# Patient Record
Sex: Female | Born: 1955
Health system: Southern US, Community
[De-identification: ages and names within clinical notes are randomized; demographics above are authoritative.]

## PROBLEM LIST (undated history)

## (undated) ENCOUNTER — Encounter

## (undated) ENCOUNTER — Ambulatory Visit: Payer: PRIVATE HEALTH INSURANCE

## (undated) ENCOUNTER — Ambulatory Visit: Attending: Internal Medicine | Primary: Internal Medicine

## (undated) ENCOUNTER — Ambulatory Visit

## (undated) ENCOUNTER — Ambulatory Visit: Payer: BLUE CROSS/BLUE SHIELD

## (undated) ENCOUNTER — Telehealth

## (undated) DIAGNOSIS — F419 Anxiety disorder, unspecified: Secondary | ICD-10-CM

## (undated) DIAGNOSIS — M810 Age-related osteoporosis without current pathological fracture: Secondary | ICD-10-CM

## (undated) DIAGNOSIS — E039 Hypothyroidism, unspecified: Secondary | ICD-10-CM

## (undated) DIAGNOSIS — K589 Irritable bowel syndrome without diarrhea: Secondary | ICD-10-CM

## (undated) DIAGNOSIS — T7840XA Allergy, unspecified, initial encounter: Secondary | ICD-10-CM

## (undated) DIAGNOSIS — M8580 Other specified disorders of bone density and structure, unspecified site: Secondary | ICD-10-CM

## (undated) DIAGNOSIS — B009 Herpesviral infection, unspecified: Secondary | ICD-10-CM

## (undated) DIAGNOSIS — E739 Lactose intolerance, unspecified: Secondary | ICD-10-CM

## (undated) DIAGNOSIS — I493 Ventricular premature depolarization: Secondary | ICD-10-CM

## (undated) HISTORY — PX: ELBOW SURGERY: SHX618

## (undated) HISTORY — DX: Hypothyroidism, unspecified: E03.9

## (undated) HISTORY — DX: Other specified disorders of bone density and structure, unspecified site: M85.80

## (undated) HISTORY — DX: Allergy, unspecified, initial encounter: T78.40XA

## (undated) HISTORY — DX: Age-related osteoporosis without current pathological fracture: M81.0

## (undated) HISTORY — DX: Ventricular premature depolarization: I49.3

## (undated) HISTORY — DX: Irritable bowel syndrome, unspecified: K58.9

## (undated) HISTORY — DX: Anxiety disorder, unspecified: F41.9

## (undated) HISTORY — DX: Lactose intolerance, unspecified: E73.9

## (undated) HISTORY — DX: Herpesviral infection, unspecified: B00.9

## (undated) HISTORY — PX: COLONOSCOPY: SHX174

## (undated) MED ORDER — LATANOPROST 0.005 % EYE DROPS: Freq: Every evening | 0.00000 days

---
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
## 1999-03-13 ENCOUNTER — Other Ambulatory Visit: Admission: RE | Admit: 1999-03-13 | Discharge: 1999-03-13 | Payer: Self-pay | Admitting: *Deleted

## 1999-04-24 ENCOUNTER — Encounter: Payer: Self-pay | Admitting: *Deleted

## 1999-04-24 ENCOUNTER — Encounter: Admission: RE | Admit: 1999-04-24 | Discharge: 1999-04-24 | Payer: Self-pay | Admitting: *Deleted

## 2000-04-08 ENCOUNTER — Other Ambulatory Visit: Admission: RE | Admit: 2000-04-08 | Discharge: 2000-04-08 | Payer: Self-pay | Admitting: *Deleted

## 2000-06-22 ENCOUNTER — Encounter: Admission: RE | Admit: 2000-06-22 | Discharge: 2000-06-22 | Payer: Self-pay | Admitting: *Deleted

## 2000-06-22 ENCOUNTER — Encounter: Payer: Self-pay | Admitting: *Deleted

## 2001-05-28 ENCOUNTER — Other Ambulatory Visit: Admission: RE | Admit: 2001-05-28 | Discharge: 2001-05-28 | Payer: Self-pay | Admitting: *Deleted

## 2001-10-08 ENCOUNTER — Ambulatory Visit (HOSPITAL_COMMUNITY): Admission: RE | Admit: 2001-10-08 | Discharge: 2001-10-08 | Payer: Self-pay | Admitting: Orthopedic Surgery

## 2001-10-08 ENCOUNTER — Encounter: Payer: Self-pay | Admitting: Orthopedic Surgery

## 2001-12-08 ENCOUNTER — Encounter: Payer: Self-pay | Admitting: Obstetrics & Gynecology

## 2001-12-08 ENCOUNTER — Encounter: Admission: RE | Admit: 2001-12-08 | Discharge: 2001-12-08 | Payer: Self-pay | Admitting: Obstetrics & Gynecology

## 2002-06-17 ENCOUNTER — Other Ambulatory Visit: Admission: RE | Admit: 2002-06-17 | Discharge: 2002-06-17 | Payer: Self-pay | Admitting: Obstetrics & Gynecology

## 2003-03-10 ENCOUNTER — Encounter: Admission: RE | Admit: 2003-03-10 | Discharge: 2003-03-10 | Payer: Self-pay | Admitting: Obstetrics & Gynecology

## 2003-07-05 ENCOUNTER — Other Ambulatory Visit: Admission: RE | Admit: 2003-07-05 | Discharge: 2003-07-05 | Payer: Self-pay | Admitting: Obstetrics & Gynecology

## 2004-05-14 ENCOUNTER — Encounter: Admission: RE | Admit: 2004-05-14 | Discharge: 2004-05-14 | Payer: Self-pay | Admitting: Obstetrics & Gynecology

## 2004-12-23 ENCOUNTER — Ambulatory Visit: Payer: Self-pay | Admitting: Internal Medicine

## 2005-02-03 HISTORY — PX: ELBOW SURGERY: SHX618

## 2005-06-03 ENCOUNTER — Ambulatory Visit: Payer: Self-pay | Admitting: Internal Medicine

## 2005-06-25 ENCOUNTER — Encounter: Admission: RE | Admit: 2005-06-25 | Discharge: 2005-06-25 | Payer: Self-pay | Admitting: Obstetrics & Gynecology

## 2006-01-14 ENCOUNTER — Encounter: Payer: Self-pay | Admitting: Internal Medicine

## 2006-01-19 ENCOUNTER — Ambulatory Visit: Payer: Self-pay | Admitting: Internal Medicine

## 2006-01-19 LAB — CONVERTED CEMR LAB
Free T4: 1.1 ng/dL (ref 0.6–1.6)
T3, Free: 2.5 pg/mL (ref 2.3–4.2)
TSH: 0.38 microintl units/mL (ref 0.35–5.50)

## 2006-02-04 ENCOUNTER — Encounter: Payer: Self-pay | Admitting: Internal Medicine

## 2006-02-04 ENCOUNTER — Ambulatory Visit: Payer: Self-pay

## 2006-02-04 ENCOUNTER — Encounter: Payer: Self-pay | Admitting: Cardiology

## 2006-02-16 ENCOUNTER — Ambulatory Visit: Payer: Self-pay | Admitting: Internal Medicine

## 2006-03-11 ENCOUNTER — Encounter: Admission: RE | Admit: 2006-03-11 | Discharge: 2006-03-11 | Payer: Self-pay | Admitting: Internal Medicine

## 2006-03-23 ENCOUNTER — Encounter (INDEPENDENT_AMBULATORY_CARE_PROVIDER_SITE_OTHER): Payer: Self-pay | Admitting: Specialist

## 2006-03-23 ENCOUNTER — Other Ambulatory Visit: Admission: RE | Admit: 2006-03-23 | Discharge: 2006-03-23 | Payer: Self-pay | Admitting: Interventional Radiology

## 2006-03-23 ENCOUNTER — Encounter: Admission: RE | Admit: 2006-03-23 | Discharge: 2006-03-23 | Payer: Self-pay | Admitting: Internal Medicine

## 2006-04-15 ENCOUNTER — Ambulatory Visit: Payer: Self-pay | Admitting: Internal Medicine

## 2006-05-26 ENCOUNTER — Encounter: Admission: RE | Admit: 2006-05-26 | Discharge: 2006-05-26 | Payer: Self-pay | Admitting: Obstetrics & Gynecology

## 2006-05-29 ENCOUNTER — Encounter (INDEPENDENT_AMBULATORY_CARE_PROVIDER_SITE_OTHER): Payer: Self-pay | Admitting: Specialist

## 2006-05-29 ENCOUNTER — Encounter: Admission: RE | Admit: 2006-05-29 | Discharge: 2006-05-29 | Payer: Self-pay | Admitting: Obstetrics & Gynecology

## 2006-07-01 ENCOUNTER — Encounter: Admission: RE | Admit: 2006-07-01 | Discharge: 2006-07-01 | Payer: Self-pay | Admitting: Endocrinology

## 2006-07-17 ENCOUNTER — Ambulatory Visit: Payer: Self-pay | Admitting: Internal Medicine

## 2006-07-17 LAB — CONVERTED CEMR LAB
Free T4: 1.1 ng/dL (ref 0.6–1.6)
T3, Free: 2.6 pg/mL (ref 2.3–4.2)
TSH: 0.61 microintl units/mL (ref 0.35–5.50)

## 2006-09-04 DIAGNOSIS — E785 Hyperlipidemia, unspecified: Secondary | ICD-10-CM | POA: Insufficient documentation

## 2006-09-04 DIAGNOSIS — J45909 Unspecified asthma, uncomplicated: Secondary | ICD-10-CM | POA: Insufficient documentation

## 2006-09-09 ENCOUNTER — Encounter: Admission: RE | Admit: 2006-09-09 | Discharge: 2006-09-09 | Payer: Self-pay | Admitting: Internal Medicine

## 2006-09-16 ENCOUNTER — Ambulatory Visit: Payer: Self-pay | Admitting: Internal Medicine

## 2006-09-16 DIAGNOSIS — B009 Herpesviral infection, unspecified: Secondary | ICD-10-CM | POA: Insufficient documentation

## 2006-09-16 DIAGNOSIS — E039 Hypothyroidism, unspecified: Secondary | ICD-10-CM | POA: Insufficient documentation

## 2006-09-16 DIAGNOSIS — F419 Anxiety disorder, unspecified: Secondary | ICD-10-CM | POA: Insufficient documentation

## 2006-10-28 ENCOUNTER — Ambulatory Visit: Payer: Self-pay | Admitting: Internal Medicine

## 2006-11-03 ENCOUNTER — Telehealth: Payer: Self-pay | Admitting: Internal Medicine

## 2006-11-04 ENCOUNTER — Ambulatory Visit: Payer: Self-pay | Admitting: Internal Medicine

## 2006-11-04 DIAGNOSIS — M899 Disorder of bone, unspecified: Secondary | ICD-10-CM | POA: Insufficient documentation

## 2006-11-04 DIAGNOSIS — M949 Disorder of cartilage, unspecified: Secondary | ICD-10-CM

## 2006-12-14 ENCOUNTER — Ambulatory Visit: Payer: Self-pay | Admitting: Internal Medicine

## 2006-12-14 DIAGNOSIS — J029 Acute pharyngitis, unspecified: Secondary | ICD-10-CM | POA: Insufficient documentation

## 2007-03-01 ENCOUNTER — Telehealth (INDEPENDENT_AMBULATORY_CARE_PROVIDER_SITE_OTHER): Payer: Self-pay | Admitting: *Deleted

## 2007-03-02 ENCOUNTER — Ambulatory Visit: Payer: Self-pay | Admitting: Vascular Surgery

## 2007-03-05 ENCOUNTER — Encounter: Admission: RE | Admit: 2007-03-05 | Discharge: 2007-03-05 | Payer: Self-pay | Admitting: Internal Medicine

## 2007-03-11 ENCOUNTER — Ambulatory Visit: Payer: Self-pay | Admitting: Internal Medicine

## 2007-03-11 ENCOUNTER — Ambulatory Visit: Payer: Self-pay | Admitting: Vascular Surgery

## 2007-03-11 LAB — CONVERTED CEMR LAB
Cholesterol, target level: 200 mg/dL
HDL goal, serum: 40 mg/dL
LDL Goal: 160 mg/dL

## 2007-04-01 ENCOUNTER — Ambulatory Visit: Payer: Self-pay | Admitting: Internal Medicine

## 2007-06-24 ENCOUNTER — Ambulatory Visit: Payer: Self-pay | Admitting: Vascular Surgery

## 2007-07-07 ENCOUNTER — Ambulatory Visit: Payer: Self-pay | Admitting: Internal Medicine

## 2007-07-07 LAB — CONVERTED CEMR LAB
ALT: 18 units/L (ref 0–35)
AST: 23 units/L (ref 0–37)
Albumin: 3.9 g/dL (ref 3.5–5.2)
Alkaline Phosphatase: 60 units/L (ref 39–117)
BUN: 12 mg/dL (ref 6–23)
Basophils Absolute: 0 10*3/uL (ref 0.0–0.1)
Basophils Relative: 0.8 % (ref 0.0–1.0)
Bilirubin Urine: NEGATIVE
Bilirubin, Direct: 0.1 mg/dL (ref 0.0–0.3)
Blood in Urine, dipstick: NEGATIVE
CO2: 30 meq/L (ref 19–32)
Calcium: 8.4 mg/dL (ref 8.4–10.5)
Chloride: 113 meq/L — ABNORMAL HIGH (ref 96–112)
Cholesterol: 179 mg/dL (ref 0–200)
Creatinine, Ser: 0.7 mg/dL (ref 0.4–1.2)
Eosinophils Absolute: 0.1 10*3/uL (ref 0.0–0.7)
Eosinophils Relative: 2.3 % (ref 0.0–5.0)
GFR calc Af Amer: 114 mL/min
GFR calc non Af Amer: 95 mL/min
Glucose, Bld: 80 mg/dL (ref 70–99)
Glucose, Urine, Semiquant: NEGATIVE
HCT: 37.8 % (ref 36.0–46.0)
HDL: 81.1 mg/dL (ref >39.0)
Hemoglobin: 12.9 g/dL (ref 12.0–15.0)
Ketones, urine, test strip: NEGATIVE
LDL Cholesterol: 89 mg/dL (ref 0–99)
Lymphocytes Relative: 28.5 % (ref 12.0–46.0)
MCHC: 34 g/dL (ref 30.0–36.0)
MCV: 96.6 fL (ref 78.0–100.0)
Monocytes Absolute: 0.3 10*3/uL (ref 0.1–1.0)
Monocytes Relative: 7.6 % (ref 3.0–12.0)
Neutro Abs: 2.5 10*3/uL (ref 1.4–7.7)
Neutrophils Relative %: 60.8 % (ref 43.0–77.0)
Nitrite: NEGATIVE
Platelets: 299 10*3/uL (ref 150–400)
Potassium: 4 meq/L (ref 3.5–5.1)
Protein, U semiquant: NEGATIVE
RBC: 3.91 M/uL (ref 3.87–5.11)
RDW: 11.8 % (ref 11.5–14.6)
Sodium: 136 meq/L (ref 135–145)
Specific Gravity, Urine: 1.025
TSH: 0.27 microintl units/mL — ABNORMAL LOW (ref 0.35–5.50)
Total Bilirubin: 0.9 mg/dL (ref 0.3–1.2)
Total CHOL/HDL Ratio: 2.2
Total Protein: 6.8 g/dL (ref 6.0–8.3)
Triglycerides: 47 mg/dL (ref 0–149)
Urobilinogen, UA: 0.2
VLDL: 9 mg/dL (ref 0–40)
WBC Urine, dipstick: NEGATIVE
WBC: 4 10*3/uL — ABNORMAL LOW (ref 4.5–10.5)
pH: 5

## 2007-07-14 ENCOUNTER — Ambulatory Visit: Payer: Self-pay | Admitting: Internal Medicine

## 2007-07-14 DIAGNOSIS — L719 Rosacea, unspecified: Secondary | ICD-10-CM | POA: Insufficient documentation

## 2007-07-14 DIAGNOSIS — E041 Nontoxic single thyroid nodule: Secondary | ICD-10-CM | POA: Insufficient documentation

## 2007-07-28 ENCOUNTER — Encounter: Admission: RE | Admit: 2007-07-28 | Discharge: 2007-07-28 | Payer: Self-pay | Admitting: Obstetrics & Gynecology

## 2007-07-28 ENCOUNTER — Encounter: Admission: RE | Admit: 2007-07-28 | Discharge: 2007-07-28 | Payer: Self-pay | Admitting: Internal Medicine

## 2007-08-18 ENCOUNTER — Ambulatory Visit: Payer: Self-pay | Admitting: Internal Medicine

## 2007-08-18 DIAGNOSIS — K589 Irritable bowel syndrome without diarrhea: Secondary | ICD-10-CM | POA: Insufficient documentation

## 2007-08-18 DIAGNOSIS — B079 Viral wart, unspecified: Secondary | ICD-10-CM | POA: Insufficient documentation

## 2007-08-25 ENCOUNTER — Telehealth: Payer: Self-pay | Admitting: Internal Medicine

## 2007-11-25 ENCOUNTER — Ambulatory Visit: Payer: Self-pay | Admitting: Internal Medicine

## 2008-03-09 ENCOUNTER — Ambulatory Visit: Payer: Self-pay | Admitting: Internal Medicine

## 2008-07-25 ENCOUNTER — Telehealth (INDEPENDENT_AMBULATORY_CARE_PROVIDER_SITE_OTHER): Payer: Self-pay | Admitting: *Deleted

## 2008-08-21 ENCOUNTER — Encounter: Payer: Self-pay | Admitting: Internal Medicine

## 2008-09-04 ENCOUNTER — Encounter: Admission: RE | Admit: 2008-09-04 | Discharge: 2008-09-04 | Payer: Self-pay | Admitting: Obstetrics & Gynecology

## 2008-10-24 ENCOUNTER — Telehealth: Payer: Self-pay | Admitting: *Deleted

## 2008-10-25 ENCOUNTER — Ambulatory Visit: Payer: Self-pay | Admitting: Internal Medicine

## 2008-10-25 LAB — CONVERTED CEMR LAB
Bilirubin Urine: NEGATIVE
Glucose, Urine, Semiquant: NEGATIVE
Ketones, urine, test strip: NEGATIVE
Nitrite: NEGATIVE
Protein, U semiquant: NEGATIVE
Specific Gravity, Urine: 1.01
Urobilinogen, UA: 0.2
pH: 5.5

## 2008-11-01 ENCOUNTER — Telehealth: Payer: Self-pay | Admitting: Internal Medicine

## 2009-09-14 ENCOUNTER — Encounter: Admission: RE | Admit: 2009-09-14 | Discharge: 2009-09-14 | Payer: Self-pay | Admitting: Obstetrics & Gynecology

## 2009-09-21 ENCOUNTER — Encounter: Admission: RE | Admit: 2009-09-21 | Discharge: 2009-09-21 | Payer: Self-pay | Admitting: Obstetrics & Gynecology

## 2009-10-17 ENCOUNTER — Encounter: Admission: RE | Admit: 2009-10-17 | Discharge: 2009-10-17 | Payer: Self-pay | Admitting: Internal Medicine

## 2009-12-04 ENCOUNTER — Ambulatory Visit: Payer: Self-pay | Admitting: Family Medicine

## 2009-12-04 ENCOUNTER — Telehealth (INDEPENDENT_AMBULATORY_CARE_PROVIDER_SITE_OTHER): Payer: Self-pay | Admitting: *Deleted

## 2009-12-04 DIAGNOSIS — R197 Diarrhea, unspecified: Secondary | ICD-10-CM | POA: Insufficient documentation

## 2009-12-04 LAB — CONVERTED CEMR LAB
ALT: 18 units/L (ref 0–35)
AST: 23 units/L (ref 0–37)
Albumin: 3.7 g/dL (ref 3.5–5.2)
Alkaline Phosphatase: 49 units/L (ref 39–117)
BUN: 12 mg/dL (ref 6–23)
Basophils Absolute: 0 10*3/uL (ref 0.0–0.1)
Basophils Relative: 0.7 % (ref 0.0–3.0)
Bilirubin, Direct: 0.1 mg/dL (ref 0.0–0.3)
CO2: 28 meq/L (ref 19–32)
Calcium: 9.1 mg/dL (ref 8.4–10.5)
Chloride: 105 meq/L (ref 96–112)
Creatinine, Ser: 0.7 mg/dL (ref 0.4–1.2)
Eosinophils Absolute: 0.1 10*3/uL (ref 0.0–0.7)
Eosinophils Relative: 2.4 % (ref 0.0–5.0)
GFR calc non Af Amer: 96.6 mL/min (ref >60)
Glucose, Bld: 77 mg/dL (ref 70–99)
HCT: 37.6 % (ref 36.0–46.0)
Hemoglobin: 13.1 g/dL (ref 12.0–15.0)
Lymphocytes Relative: 28.1 % (ref 12.0–46.0)
Lymphs Abs: 1.5 10*3/uL (ref 0.7–4.0)
MCHC: 34.8 g/dL (ref 30.0–36.0)
MCV: 94.9 fL (ref 78.0–100.0)
Monocytes Absolute: 0.5 10*3/uL (ref 0.1–1.0)
Monocytes Relative: 8.7 % (ref 3.0–12.0)
Neutro Abs: 3.2 10*3/uL (ref 1.4–7.7)
Neutrophils Relative %: 60.1 % (ref 43.0–77.0)
Platelets: 382 10*3/uL (ref 150.0–400.0)
Potassium: 4.4 meq/L (ref 3.5–5.1)
RBC: 3.96 M/uL (ref 3.87–5.11)
RDW: 13.3 % (ref 11.5–14.6)
Sed Rate: 16 mm/hr (ref 0–22)
Sodium: 139 meq/L (ref 135–145)
TSH: 0.96 microintl units/mL (ref 0.35–5.50)
Total Bilirubin: 0.8 mg/dL (ref 0.3–1.2)
Total Protein: 6.6 g/dL (ref 6.0–8.3)
WBC: 5.4 10*3/uL (ref 4.5–10.5)

## 2009-12-07 ENCOUNTER — Encounter: Payer: Self-pay | Admitting: Internal Medicine

## 2009-12-07 ENCOUNTER — Telehealth: Payer: Self-pay | Admitting: Internal Medicine

## 2009-12-21 ENCOUNTER — Encounter: Payer: Self-pay | Admitting: Internal Medicine

## 2010-02-05 ENCOUNTER — Ambulatory Visit: Admission: RE | Admit: 2010-02-05 | Payer: Self-pay | Source: Home / Self Care | Admitting: Internal Medicine

## 2010-02-05 ENCOUNTER — Encounter: Payer: Self-pay | Admitting: Internal Medicine

## 2010-03-05 NOTE — Progress Notes (Signed)
Summary: test results  Phone Note Call from Patient Call back at Work Phone [phone]   Caller: Patient Call For: Dr. Abner Greenspan Summary of Call: Please call regarding test results. Initial call taken by: Lynann Beaver CMA AAMA,  December 07, 2009 12:13 PM  Follow-up for Phone Call        Patient called requesting results. Follow-up by: Josph Macho RMA,  December 10, 2009 12:54 PM  Additional Follow-up for Phone Call Additional follow up Details #1::        Pt called back again requesting lab results. Additional Follow-up by: Lucy Antigua,  December 10, 2009 2:58 PM    Additional Follow-up for Phone Call Additional follow up Details #2::    LABS WERE ALL NEGATIVE- HAS BEEN TAKING PEPTO BISMOL- SUGGESTED SHE DRINK CLEAR LIQUIDS FOR 48 HOURS AND TAKE I MODIUM AND IF NOT A LOT BETTER BY TOMORROW CALL BACK TOMORROW AFTERNOON Follow-up by: Willy Eddy, LPN,  December 10, 2009 4:54 PM

## 2010-03-05 NOTE — Assessment & Plan Note (Signed)
Summary: DIARRHEA // RS   Vital Signs:  Patient profile:   55 year old female Height:      65 inches (165.10 cm) Weight:      127 pounds (57.73 kg) BMI:     21.21 O2 Sat:      97 % on Room air Temp:     98.3 degrees F (36.83 degrees C) oral Pulse rate:   74 / minute BP sitting:   132 / 108  (left arm) Cuff size:   regular  Vitals Entered By: Josph Macho RMA (December 04, 2009 9:19 AM)  O2 Flow:  Room air CC: Diarrhea X9 days/ CF Is Patient Diabetic? No   History of Present Illness: Patient is a  55 yo caucasian female in today for evaluation of diarrhea x 9 days. A week ago Monday it started with increased gurgling, rumbling in stomach and then she began to have frequent, loose stool. she describes the diarrhea is loose smelly. She describes fecal urgency and frequency. Has had some intermittent nausea and anorexia but then followed by extreme hunger. She denies fevers, chills, vomiting, back pain. Denies any significant abdominal pain except some cramping right before a bowel movement. Denies chest pain shortness of breath denies any external irritation or hemorrhoids. 10 years ago did have a bad episode of recurrent diarrhea with significant weight loss and was ultimately diagnosed with IBS. The symptoms didn't return and were associated with significant pain. Her only change in medications has been her OB/GYN started her on Cymbalta 30 mg p.o. q.d. but that was back in August and that was for some irritability and perimenopausal symptoms probably other complaints this week or increased fatigue she did have a mild left temporal headache but that is resolved and some generalized myalgias most notably with some intermittent stiffness in her neck especially when turning her head. And some stiffness into her shoulder blades as well of note her mother and her maternal grandmother was noted to have significant reflux issues in her life times. try a line in the past but felt that caused worse  diarrhea has not tried anything in that regard presently. He is running out of her Atrovent for her exercise-induced asthma does not feel it is helpful anymore. Albuterol caused some palpitations in the past  Current Medications (verified): 1)  Synthroid 88 Mcg Tabs (Levothyroxine Sodium) .... Take One Tablet By Mouth 2)  Singulair 10 Mg  Tabs (Montelukast Sodium) .... Take 1 Tablet By Mouth Once A Day 3)  Astelin 137 Mcg/spray  Soln (Azelastine Hcl) .... As Needed 4)  Zyrtec Allergy 10 Mg  Tabs (Cetirizine Hcl) .... As Needed 5)  Atrovent Hfa 17 Mcg/act  Aers (Ipratropium Bromide Hfa) .... Two Puff By Mouth Q 4 Hours Prn 6)  Celebrex 200 Mg Caps (Celecoxib) .... One By Mouth Daily 7)  Cymbalta 30 Mg Cpep (Duloxetine Hcl) .... Once Daily  Allergies (verified): 1)  ! Augmentin  Past History:  Past medical history reviewed for relevance to current acute and chronic problems. Social history (including risk factors) reviewed for relevance to current acute and chronic problems.  Past Medical History: Reviewed history from 09/16/2006 and no changes required. Asthma Hypothyroidism HSV  Social History: Reviewed history from 09/04/2006 and no changes required. Married Former Smoker Alcohol use-yes  Review of Systems      See HPI  Physical Exam  General:  Well-developed,well-nourished,in no acute distress; alert,appropriate and cooperative throughout examination Head:  Normocephalic and atraumatic without obvious abnormalities.  No apparent alopecia or balding. Mouth:  Oral mucosa and oropharynx without lesions or exudates.  Teeth in good repair. Neck:  No deformities, masses, or tenderness noted. Lungs:  Normal respiratory effort, chest expands symmetrically. Lungs are clear to auscultation, no crackles or wheezes. Heart:  Normal rate and regular rhythm. S1 and S2 normal without gallop, murmur, click, rub or other extra sounds. Abdomen:  Bowel sounds positive,abdomen soft and  non-tender without masses, organomegaly or hernias noted. Extremities:  No clubbing, cyanosis, edema, or deformity noted with normal full range of motion of all joints.   Cervical Nodes:  No lymphadenopathy noted Psych:  Cognition and judgment appear intact. Alert and cooperative with normal attention span and concentration. No apparent delusions, illusions, hallucinationsslightly anxious.     Impression & Recommendations:  Problem # 1:  DIARRHEA (ICD-787.91)  Orders: TLB-CBC Platelet - w/Differential (85025-CBCD) TLB-BMP (Basic Metabolic Panel-BMET) (80048-METABOL) TLB-Hepatic/Liver Function Pnl (80076-HEPATIC) TLB-TSH (Thyroid Stimulating Hormone) (84443-TSH) TLB-Sedimentation Rate (ESR) (85652-ESR) T-Culture, Stool (87045/87046-70140) T-Culture, C-Diff Toxin A/B (87564-33295) T-Stool Fats [place] Stain [id number]) T-Stool Giardia / Crypto- EIA (01601) T-Stool for O&P (09323-55732) T-Culture, C-Diff Toxin A/B (20254-27062) T-Culture, C-Diff Toxin A/B (37628-31517) Gastroenterology Referral (GI) Specimen Handling (99000) Venipuncture (61607) Try a different probiotic such as [employer] colon health and start Benefiber 2 tsp by mouth two times a day as needed diarrhea and refered to GI for further evaluation and colonoscopy  Problem # 2:  ASTHMA (ICD-493.90)  The following medications were removed from the medication list:    Atrovent Hfa 17 Mcg/act Aers (Ipratropium bromide hfa) .Marland Kitchen..Marland Kitchen Two puff by mouth q 4 hours prn    Methylprednisolone 4 Mg Tabs (Methylprednisolone) .Marland KitchenMarland KitchenMarland KitchenMarland Kitchen 4 for 2 days then 3 for 2 days then 2 for 2 days then 1 for 2 days Her updated medication list for this problem includes:    Singulair 10 Mg Tabs (Montelukast sodium) .Marland Kitchen... Take 1 tablet by mouth once a day    Xopenex Hfa 45 Mcg/act Aero (Levalbuterol tartrate) .Marland Kitchen... 1-2 puffs by mouth q 4-6 hours as needed exercise/sob/wheeze Given a coupon for zero copay so she can try Xopenex to make sure she tolerates  it  Complete Medication List: 1)  Synthroid 88 Mcg Tabs (Levothyroxine sodium) .... Take one tablet by mouth 2)  Singulair 10 Mg Tabs (Montelukast sodium) .... Take 1 tablet by mouth once a day 3)  Astelin 137 Mcg/spray Soln (Azelastine hcl) .... As needed 4)  Zyrtec Allergy 10 Mg Tabs (Cetirizine hcl) .... As needed 5)  Celebrex 200 Mg Caps (Celecoxib) .... One by mouth daily 6)  Cymbalta 30 Mg Cpep (Duloxetine hcl) .... Once daily 7)  Xopenex Hfa 45 Mcg/act Aero (Levalbuterol tartrate) .Marland Kitchen.. 1-2 puffs by mouth q 4-6 hours as needed exercise/sob/wheeze  Patient Instructions: 1)  Stick with a low residue diet and avoid foods that can irritate your bowels. 2)  Avoid foods high in acid(tomatoes, citrus juices,spicy foods).Avoid eating within two hours of lying down or before exercising. Do not over eat: try smaller more frequent meals. Elevate head of bed twelve inches when sleeping.  3)  Try Benefiber powder 2 tsp by mouth two times a day as needed diarrhea 4)  Try a probiotic such [facility] and/or  lactose free yogurt Prescriptions: XOPENEX HFA 45 MCG/ACT AERO (LEVALBUTEROL TARTRATE) 1-2 puffs by mouth q 4-6 hours as needed exercise/sob/wheeze  #1 x 1   Entered and Authorized by:   Danise Edge MD   Signed by:   Danise Edge MD  on 12/04/2009   Method used:   Electronically to        [facility] (retail)       [address]       [place], [place]  [address]       Ph: [phone] or [phone]       Fax: [phone]   RxID:   [id number]    Orders Added: 1)  TLB-CBC Platelet - w/Differential [85025-CBCD] 2)  TLB-BMP (Basic Metabolic Panel-BMET) [80048-METABOL] 3)  TLB-Hepatic/Liver Function Pnl [80076-HEPATIC] 4)  TLB-TSH (Thyroid Stimulating Hormone) [84443-TSH] 5)  TLB-Sedimentation Rate (ESR) [85652-ESR] 6)  T-Culture, Stool [87045/87046-70140] 7)  T-Culture, C-Diff Toxin A/B [36644-03474] 8)  T-Stool Fats [place] Stain [25956-38756] 9)  T-Stool  Giardia / Crypto- EIA [43329] 10)  T-Stool for O&P [87177-70555] 11)  T-Culture, C-Diff Toxin A/B [51884-16606] 12)  T-Culture, C-Diff Toxin A/B [30160-10932] 13)  Gastroenterology Referral [GI] 14)  Specimen Handling [99000] 15)  Venipuncture [36415] 16)  Est. Patient Level III [35573]

## 2010-03-05 NOTE — Medication Information (Signed)
Summary: Prior Authorization Request and Approval for Celebrex  Prior Authorization Request and Approval for Celebrex   Imported By: Maryln Gottron 08/25/2008 08:08:27  _____________________________________________________________________  External Attachment:    Type:   Image     Comment:   External Document

## 2010-03-05 NOTE — Progress Notes (Signed)
Summary: Rx Refill   Phone Note Outgoing Call   Summary of Call: Pt states rx for anti diarrheal should have been called in to [facility]. Please send in Initial call taken by: Trixie Dredge,  December 04, 2009 5:13 PM  Follow-up for Phone Call        I did not send an antidiarrheal med I cannot prescribe one until I get the cultures back and make sure she does not have any bad infections Follow-up by: Danise Edge MD,  December 04, 2009 5:18 PM  Additional Follow-up for Phone Call Additional follow up Details #1::        Patient informed Additional Follow-up by: Josph Macho RMA,  December 05, 2009 8:31 AM

## 2010-03-05 NOTE — Assessment & Plan Note (Signed)
Summary: ?sinus inf ok per doc 2.15p   Vital Signs:  Patient Profile:   55 Years Old Female Height:     65 inches Temp:     98.5 degrees F oral Pulse rate:   72 / minute Resp:     12 per minute BP sitting:   120 / 70  (left arm)  Vitals Entered By: Willy Eddy, LPN (December 14, 2006 3:02 PM)                 Chief Complaint:  c/o sinusitis like sx.  History of Present Illness: sinus pressure x 24 hours with head ache and ear and throat pain no fever had chills  Current Allergies: ! AUGMENTIN  Past Medical History:    Reviewed history from 09/16/2006 and no changes required:       Asthma       Hypothyroidism       HSV     Review of Systems       The patient complains of fever and hoarseness.     Physical Exam  General:     Well-developed,well-nourished,in no acute distress; alert,appropriate and cooperative throughout examination Mouth:     tonsil hypertropied, pharyngeal erythema, pharyngeal exudate, and glossitis.   Neck:     No deformities, masses, or tenderness noted. Lungs:     no wheezes.   Heart:     Normal rate and regular rhythm. S1 and S2 normal without gallop, murmur, click, rub or other extra sounds.    Impression & Recommendations:  Problem # 1:  ACUTE PHARYNGITIS (ICD-462) zpack Her updated medication list for this problem includes:    Zithromax Z-pak 250 Mg Tabs (Azithromycin) .Marland Kitchen... Take as directed Instructed to complete antibiotics and call if not improved in 48 hours.   Complete Medication List: 1)  Synthroid 112 Mcg Tabs (Levothyroxine sodium) .... Take 1 tablet by mouth once a day 2)  Singulair 10 Mg Tabs (Montelukast sodium) .... Take 1 tablet by mouth once a day 3)  Astelin 137 Mcg/spray Soln (Azelastine hcl) .... As needed 4)  Zyrtec Allergy 10 Mg Tabs (Cetirizine hcl) .... As needed 5)  Atrovent Hfa 17 Mcg/act Aers (Ipratropium bromide hfa) .... Two puff by mouth q 4 hours prn 6)  Allerx Df 4-2.5 & 8-2.5 Mg Misc  (Chlorpheniramine-methscop) .... Two times a day 7)  Zithromax Z-pak 250 Mg Tabs (Azithromycin) .... Take as directed   Patient Instructions: 1)  Take your antibiotic as prescribed until ALL of it is gone, but stop if you develop a rash or swelling and contact our office as soon as possible.    Prescriptions: ZITHROMAX Z-PAK 250 MG  TABS (AZITHROMYCIN) take as directed  #1 x 0   Entered and Authorized by:   Stacie Glaze MD   Signed by:   Stacie Glaze MD on 12/14/2006   Method used:   Print then Give to Patient   RxID:   [id number]  ]

## 2010-03-05 NOTE — Assessment & Plan Note (Signed)
Summary: uri/njr   Vital Signs:  Patient Profile:   55 Years Old Female Height:     65 inches Temp:     98.7 degrees F oral Pulse rate:   72 / minute Resp:     14 per minute BP sitting:   120 / 78  (left arm)  Vitals Entered By: Willy Eddy, LPN (April 01, 2007 4:23 PM)                 Chief Complaint:  c/osorethroat, cough, fatigue, and and headache for 2 weeks.  History of Present Illness: recurrent sore throat for 2 weeks since last visit zero energy HA , fatigue, ear pain and increased wheezing hx of allergies, asthma, no exposure ot strept.     Current Allergies: ! AUGMENTIN  Past Medical History:    Reviewed history from 09/16/2006 and no changes required:       Asthma       Hypothyroidism       HSV  Past Surgical History:    Reviewed history from 11/04/2006 and no changes required:       Denies surgical history   Family History:    Reviewed history from 09/04/2006 and no changes required:       Family History Hypertension       Family History of Melanoma  Social History:    Reviewed history from 09/04/2006 and no changes required:       Married       Former Smoker       Alcohol use-yes    Review of Systems  The patient denies anorexia, fever, weight loss, weight gain, vision loss, decreased hearing, hoarseness, chest pain, syncope, dyspnea on exhertion, peripheral edema, prolonged cough, hemoptysis, abdominal pain, melena, hematochezia, severe indigestion/heartburn, hematuria, incontinence, genital sores, muscle weakness, suspicious skin lesions, transient blindness, difficulty walking, depression, unusual weight change, enlarged lymph nodes, angioedema, breast masses, and testicular masses.     Physical Exam  General:     Well-developed,well-nourished,in no acute distress; alert,appropriate and cooperative throughout examination Head:     Normocephalic and atraumatic without obvious abnormalities. No apparent alopecia or balding.  Eyes:     pupils equal and pupils round.   Nose:     nasal dischargemucosal pallor.   Mouth:     tonsil hypertropied, pharyngeal erythema, pharyngeal exudate, and glossitis.   Neck:     No deformities, masses, or tenderness noted. Lungs:     no wheezes.  but tight Heart:     Normal rate and regular rhythm. S1 and S2 normal without gallop, murmur, click, rub or other extra sounds. Abdomen:     soft, non-tender, and normal bowel sounds.      Impression & Recommendations:  Problem # 1:  URI (ICD-465.9) probable viral but pulse, RR and sats suggest impending asthma flair probable viral induced The following medications were removed from the medication list:    Allerx Df 4-2.5 & 8-2.5 Mg Misc (Chlorpheniramine-methscop) .Marland Kitchen..Marland Kitchen Two times a day  Her updated medication list for this problem includes:    Zyrtec Allergy 10 Mg Tabs (Cetirizine hcl) .Marland Kitchen... As needed    Atuss Ds 30-4-30 Mg/34ml Susp (Pseudoephed hcl-cpm-dm hbr tan) .Marland Kitchen..Marland Kitchen Two tsp by mouth q 6 hour prn Instructed on symptomatic treatment. Call if symptoms persist or worsen.   Problem # 2:  URI (ICD-465.9)  The following medications were removed from the medication list:    Allerx Df 4-2.5 & 8-2.5 Mg Misc (Chlorpheniramine-methscop) .Marland Kitchen..Marland Kitchen Two  times a day  Her updated medication list for this problem includes:    Zyrtec Allergy 10 Mg Tabs (Cetirizine hcl) .Marland Kitchen... As needed    Atuss Ds 30-4-30 Mg/53ml Susp (Pseudoephed hcl-cpm-dm hbr tan) .Marland Kitchen..Marland Kitchen Two tsp by mouth q 6 hour prn Instructed on symptomatic treatment. Call if symptoms persist or worsen.   Problem # 3:  ASTHMA (ICD-493.90)  Her updated medication list for this problem includes:    Singulair 10 Mg Tabs (Montelukast sodium) .Marland Kitchen... Take 1 tablet by mouth once a day    Atrovent Hfa 17 Mcg/act Aers (Ipratropium bromide hfa) .Marland Kitchen..Marland Kitchen Two puff by mouth q 4 hours prn    Medrol 4 Mg Tabs (Methylprednisolone) .Marland KitchenMarland KitchenMarland KitchenMarland Kitchen 4 by mouth x3 days, 3 by mouth x 3 days the 2 by mouth for 2 days, the  1 by mouth x 2 days then off   Complete Medication List: 1)  Synthroid 112 Mcg Tabs (Levothyroxine sodium) .... Take 1 tablet by mouth once a day 2)  Singulair 10 Mg Tabs (Montelukast sodium) .... Take 1 tablet by mouth once a day 3)  Astelin 137 Mcg/spray Soln (Azelastine hcl) .... As needed 4)  Zyrtec Allergy 10 Mg Tabs (Cetirizine hcl) .... As needed 5)  Atrovent Hfa 17 Mcg/act Aers (Ipratropium bromide hfa) .... Two puff by mouth q 4 hours prn 6)  Optivar 0.05 % Soln (Azelastine hcl) .... One drop ou bid 7)  Doxycycline Hyclate 100 Mg Tabs (Doxycycline hyclate) .... One by mouth bid 8)  Medrol 4 Mg Tabs (Methylprednisolone) .... 4 by mouth x3 days, 3 by mouth x 3 days the 2 by mouth for 2 days, the 1 by mouth x 2 days then off 9)  Atuss Ds 30-4-30 Mg/66ml Susp (Pseudoephed hcl-cpm-dm hbr tan) .... Two tsp by mouth q 6 hour prn   Patient Instructions: 1)  Take your antibiotic as prescribed until ALL of it is gone, but stop if you develop a rash or swelling and contact our office as soon as possible.    Prescriptions: ATUSS DS 30-4-30 MG/5ML  SUSP (PSEUDOEPHED HCL-CPM-DM HBR TAN) two tsp by mouth q 6 hour prn  #6 oz x 0   Entered and Authorized by:   Stacie Glaze MD   Signed by:   Stacie Glaze MD on 04/01/2007   Method used:   Electronically sent to ...       [facility]       [address]       [place], [place]  [id number]       Ph: [phone] or [phone]       Fax: [phone]   RxID:   [id number] MEDROL 4 MG  TABS (METHYLPREDNISOLONE) 4 by mouth x3 days, 3 by mouth x 3 days the 2 by mouth for 2 days, the 1 by mouth x 2 days then off  #28 x 0   Entered and Authorized by:   Stacie Glaze MD   Signed by:   Stacie Glaze MD on 04/01/2007   Method used:   Electronically sent to ...       [facility]       [address]       [place], [place]  [id number]       Ph: [phone] or [phone]       Fax: [phone]   RxID:   [id number]  DOXYCYCLINE HYCLATE 100 MG  TABS (DOXYCYCLINE HYCLATE) one by mouth bid  #20 x 0  Entered and Authorized by:   Stacie Glaze MD   Signed by:   Stacie Glaze MD on 04/01/2007   Method used:   Electronically sent to ...       [facility]       [address]       [place], [place]  [address]       Ph: [phone] or [phone]       Fax: [phone]   RxID:   [id number]  ]  Appended Document: Orders Update    Clinical Lists Changes  Orders: Added new Service order of Depo- Medrol 40mg  (J1030) - Signed Added new Service order of Admin of Therapeutic Inj  intramuscular or subcutaneous (36644) - Signed        Medication Administration  Injection # 1:    Medication: Depo- Medrol 80mg     Diagnosis: ACUTE PHARYNGITIS (ICD-462)    Route: IM    Site: RUOQ gluteus    Exp Date: 01/04/2008    Lot #: [id number]    Mfr: sicor    Given by: Willy Eddy, LPN (April 01, 2007 5:05 PM)  Orders Added: 1)  Depo- Medrol 40mg  [J1030] 2)  Admin of Therapeutic Inj  intramuscular or subcutaneous [56387]

## 2010-03-05 NOTE — Letter (Signed)
Summary: New Patient letter  [facility]  [address] [place], [place] [address]   Phone: [phone]  Fax: [phone]       12/21/2009 MRN: [id number]  [facility] [place] [address] [place], [place]  [address]  Dear Tasha Jackson,  Welcome to the Gastroenterology Division at [facility].    You are scheduled to see Dr.  Juanda Chance on 02-05-2010 at 9:15am on the 3rd floor at [facility], [address]  We ask that you try to arrive at our office 15 minutes prior to your appointment time to allow for check-in.  We would like you to complete the enclosed self-administered evaluation form prior to your visit and bring it with you on the day of your appointment.  We will review it with you.  Also, please bring a complete list of all your medications or, if you prefer, bring the medication bottles and we will list them.  Please bring your insurance card so that we may make a copy of it.  If your insurance requires a referral to see a specialist, please bring your referral form from your primary care physician.  Co-payments are due at the time of your visit and may be paid by cash, check or credit card.     Your office visit will consist of a consult with your physician (includes a physical exam), any laboratory testing he/she may order, scheduling of any necessary diagnostic testing (e.g. x-ray, ultrasound, CT-scan), and scheduling of a procedure (e.g. Endoscopy, Colonoscopy) if required.  Please allow enough time on your schedule to allow for any/all of these possibilities.    If you cannot keep your appointment, please call [phone] to cancel or reschedule prior to your appointment date.  This allows [name] the opportunity to schedule an appointment for another patient in need of care.  If you do not cancel or reschedule by 5 p.m. the business day prior to your appointment date, you will be charged a $50.00 late cancellation/no-show fee.    Thank you for choosing  [facility] for your medical needs.  We appreciate the opportunity to care for you.  Please visit [name] at our website  to learn more about our practice.                     Sincerely,                                                             The Gastroenterology Division

## 2010-03-05 NOTE — Progress Notes (Signed)
Summary: appt  Phone Note Call from Patient Call back at Home Phone [phone]   Caller: Patient Call For: Dr Lovell Sheehan Summary of Call: pt say she has a sinus infection and feel that she has the virus. pls call to see if she can come in today or tomorrow [facility] Initial call taken by: Shan Levans,  November 03, 2006 2:50 PM  Follow-up for Phone Call        aPPOINTMENT given tomorrow.  Patient notified. Follow-up by: Rudy Jew, RN,  November 03, 2006 3:03 PM

## 2010-03-05 NOTE — Assessment & Plan Note (Signed)
Summary: uri,uti/bmw   Vital Signs:  Patient profile:   55 year old female Height:      65 inches Weight:      125 pounds BMI:     20.88 Temp:     98.3 degrees F oral Pulse rate:   72 / minute Resp:     14 per minute BP sitting:   122 / 78  (left arm)  Vitals Entered By: Willy Eddy, LPN (October 25, 2008 11:33 AM)  CC:  c/o uti sx and sinusitis like sx.  History of Present Illness: left the windows open and the allergies flaired with bad head aches. She has symptoms for UTI with urgency and burning Increased PND with sore throat butt no ever has not been excessively SOB but has felt some heaviness   Problems Prior to Update: 1)  Wart, Viral  (ICD-078.10) 2)  Ibs  (ICD-564.1) 3)  Preventive Health Care  (ICD-V70.0) 4)  Acne Rosacea  (ICD-695.3) 5)  Thyroid Nodule  (ICD-241.0) 6)  Acute Pharyngitis  (ICD-462) 7)  Osteopenia  (ICD-733.90) 8)  Uri  (ICD-465.9) 9)  Palpitations, Recurrent  (ICD-785.1) 10)  Herpes Simplex, Uncomplicated  (ICD-054.9) 11)  Hypothyroidism  (ICD-244.9) 12)  Family History of Melanoma  (ICD-V16.8) 13)  Hyperlipidemia  (ICD-272.4) 14)  Asthma  (ICD-493.90)  Medications Prior to Update: 1)  Synthroid 112 Mcg Tabs (Levothyroxine Sodium) .... Take 1 Tablet By Mouth Once A Day 2)  Singulair 10 Mg  Tabs (Montelukast Sodium) .... Take 1 Tablet By Mouth Once A Day 3)  Astelin 137 Mcg/spray  Soln (Azelastine Hcl) .... As Needed 4)  Zyrtec Allergy 10 Mg  Tabs (Cetirizine Hcl) .... As Needed 5)  Atrovent Hfa 17 Mcg/act  Aers (Ipratropium Bromide Hfa) .... Two Puff By Mouth Q 4 Hours Prn 6)  Minocin 50 Mg  Caps (Minocycline Hcl) .... One By Mouth Daily 7)  Duac Cs 1-5 %  Kit (Clindamycin-Benzoyl Per-Cleans) .... Apply To Face As Directed 8)  Methylprednisolone 4 Mg Tabs (Methylprednisolone) .... 4 For 2 Days Then 3 For 2 Days Then 2 For 2 Days Then 1 For 2 Days 9)  Clarithromycin 500 Mg Xr24h-Tab (Clarithromycin) .... Two By Mouth Daily For 7 Days  10)  Allanvan-Dm 12.07-03-23 Mg/10ml Susp (Phenyleph Tan-Pyril Tan-Dm Tan) .... Two Tsp By Mouth Two Times A Day  ( May Substitue Similar Product) 11)  Celebrex 200 Mg Caps (Celecoxib) .... One By Mouth Daily  Current Medications (verified): 1)  Synthroid 112 Mcg Tabs (Levothyroxine Sodium) .... Take 1 Tablet By Mouth Once A Day 2)  Singulair 10 Mg  Tabs (Montelukast Sodium) .... Take 1 Tablet By Mouth Once A Day 3)  Astelin 137 Mcg/spray  Soln (Azelastine Hcl) .... As Needed 4)  Zyrtec Allergy 10 Mg  Tabs (Cetirizine Hcl) .... As Needed 5)  Atrovent Hfa 17 Mcg/act  Aers (Ipratropium Bromide Hfa) .... Two Puff By Mouth Q 4 Hours Prn 6)  Minocin 50 Mg  Caps (Minocycline Hcl) .... One By Mouth Daily 7)  Duac Cs 1-5 %  Kit (Clindamycin-Benzoyl Per-Cleans) .... Apply To Face As Directed 8)  Methylprednisolone 4 Mg Tabs (Methylprednisolone) .... 4 For 2 Days Then 3 For 2 Days Then 2 For 2 Days Then 1 For 2 Days 9)  Celebrex 200 Mg Caps (Celecoxib) .... One By Mouth Daily 10)  Levaquin 500 Mg Tabs (Levofloxacin) .... One By Mouth Daily For 7 Days  ( 3 Samples Given)  Allergies (verified): 1)  !  Augmentin  Past History:  Family History: Last updated: 09/04/2006 Family History Hypertension Family History of Melanoma  Social History: Last updated: 09/04/2006 Married Former Smoker Alcohol use-yes  Risk Factors: Smoking Status: quit (09/04/2006)  Past medical, surgical, family and social histories (including risk factors) reviewed, and no changes noted (except as noted below).  Past Medical History: Reviewed history from 09/16/2006 and no changes required. Asthma Hypothyroidism HSV  Past Surgical History: Reviewed history from 11/04/2006 and no changes required. Denies surgical history  Family History: Reviewed history from 09/04/2006 and no changes required. Family History Hypertension Family History of Melanoma  Social History: Reviewed history from 09/04/2006 and no changes  required. Married Former Smoker Alcohol use-yes  Review of Systems  The patient denies anorexia, fever, weight loss, weight gain, vision loss, decreased hearing, hoarseness, chest pain, syncope, dyspnea on exertion, peripheral edema, prolonged cough, headaches, hemoptysis, abdominal pain, melena, hematochezia, severe indigestion/heartburn, hematuria, incontinence, genital sores, muscle weakness, suspicious skin lesions, transient blindness, difficulty walking, depression, unusual weight change, abnormal bleeding, enlarged lymph nodes, angioedema, and breast masses.    Physical Exam  General:  Well-developed,well-nourished,in no acute distress; alert,appropriate and cooperative throughout examination Head:  Normocephalic and atraumatic without obvious abnormalities. No apparent alopecia or balding. Eyes:  pupils equal and pupils round.   Ears:  R ear normal and L ear normal.   Nose:  nasal dischargemucosal pallor, mucosal erythema, and mucosal edema.   Mouth:  pharyngeal exudate, posterior lymphoid hypertrophy, and postnasal drip.   Neck:  No deformities, masses, or tenderness noted. Lungs:  Normal respiratory effort, chest expands symmetrically. Lungs are clear to auscultation, no crackles or wheezes. Heart:  Normal rate and regular rhythm. S1 and S2 normal without gallop, murmur, click, rub or other extra sounds. Abdomen:  Bowel sounds positive,abdomen soft and non-tender without masses, organomegaly or hernias noted.   Impression & Recommendations:  Problem # 1:  ACUTE SINUSITIS, UNSPECIFIED (ICD-461.9) Assessment New  add  mucines and rx  The following medications were removed from the medication list:    Clarithromycin 500 Mg Xr24h-tab (Clarithromycin) .Marland Kitchen..Marland Kitchen Two by mouth daily for 7 days    Allanvan-dm 12.07-03-23 Mg/26ml Susp (Phenyleph tan-pyril tan-dm tan) .Marland Kitchen..Marland Kitchen Two tsp by mouth two times a day  ( may substitue similar product) Her updated medication list for this problem  includes:    Astelin 137 Mcg/spray Soln (Azelastine hcl) .Marland Kitchen... As needed    Minocin 50 Mg Caps (Minocycline hcl) ..... One by mouth daily    Levaquin 500 Mg Tabs (Levofloxacin) ..... One by mouth daily for 7 days  ( 3 samples given)  Instructed on treatment. Call if symptoms persist or worsen.   Problem # 2:  UTI (ICD-599.0) Assessment: New acute UTI The following medications were removed from the medication list:    Clarithromycin 500 Mg Xr24h-tab (Clarithromycin) .Marland Kitchen..Marland Kitchen Two by mouth daily for 7 days Her updated medication list for this problem includes:    Minocin 50 Mg Caps (Minocycline hcl) ..... One by mouth daily    Levaquin 500 Mg Tabs (Levofloxacin) ..... One by mouth daily for 7 days  ( 3 samples given)  Complete Medication List: 1)  Synthroid 112 Mcg Tabs (Levothyroxine sodium) .... Take 1 tablet by mouth once a day 2)  Singulair 10 Mg Tabs (Montelukast sodium) .... Take 1 tablet by mouth once a day 3)  Astelin 137 Mcg/spray Soln (Azelastine hcl) .... As needed 4)  Zyrtec Allergy 10 Mg Tabs (Cetirizine hcl) .... As needed 5)  Atrovent  Hfa 17 Mcg/act Aers (Ipratropium bromide hfa) .... Two puff by mouth q 4 hours prn 6)  Minocin 50 Mg Caps (Minocycline hcl) .... One by mouth daily 7)  Duac Cs 1-5 % Kit (Clindamycin-benzoyl per-cleans) .... Apply to face as directed 8)  Methylprednisolone 4 Mg Tabs (Methylprednisolone) .... 4 for 2 days then 3 for 2 days then 2 for 2 days then 1 for 2 days 9)  Celebrex 200 Mg Caps (Celecoxib) .... One by mouth daily 10)  Levaquin 500 Mg Tabs (Levofloxacin) .... One by mouth daily for 7 days  ( 3 samples given)  Patient Instructions: 1)  mucines dm two times a day and take the fulll course of antibitics Prescriptions: LEVAQUIN 500 MG TABS (LEVOFLOXACIN) one by mouth daily for 7 days  ( 3 samples given)  #4 x 0   Entered and Authorized by:   Stacie Glaze MD   Signed by:   Stacie Glaze MD on 10/25/2008   Method used:   Electronically to         [facility] (retail)       [address]       [place], [place]  [id number]       Ph: [phone] or [phone]       Fax: [phone]   RxID:   [id number]   Laboratory Results   Urine Tests    Routine Urinalysis   Color: yellow Appearance: Clear Glucose: negative   (Normal Range: Negative) Bilirubin: negative   (Normal Range: Negative) Ketone: negative   (Normal Range: Negative) Spec. Gravity: 1.010   (Normal Range: 1.003-1.035) Blood: 1+   (Normal Range: Negative) pH: 5.5   (Normal Range: 5.0-8.0) Protein: negative   (Normal Range: Negative) Urobilinogen: 0.2   (Normal Range: 0-1) Nitrite: negative   (Normal Range: Negative) Leukocyte Esterace: 1+   (Normal Range: Negative)    Comments: Joanne Chars CMA  October 25, 2008 10:55 AM

## 2010-03-05 NOTE — Progress Notes (Signed)
Summary: UTI and sinus infection  Phone Note Call from Patient Call back at Work Phone [phone]   Caller: Patient Call For: Stacie Glaze MD Summary of Call: Pt is complaining of post nasal drainage with headache and sinus symptoms, and also dysuria, urgency and frequency of urination.  Requesting OV.  Sheliah Plane [phone] Initial call taken by: Lynann Beaver CMA,  October 24, 2008 9:47 AM  Follow-up for Phone Call        ov given for tomorrow Follow-up by: Willy Eddy, LPN,  October 24, 2008 1:44 PM

## 2010-03-05 NOTE — Assessment & Plan Note (Signed)
Summary: CPX/JLS   Vital Signs:  Patient Profile:   55 Years Old Female Height:     65 inches Weight:      120 pounds Temp:     98.2 degrees F oral Pulse rate:   72 / minute Resp:     14 per minute BP sitting:   110 / 70  (left arm)  Vitals Entered By: Willy Eddy, LPN (July 14, 2007 11:25 AM)                 Chief Complaint:  cpx-=yearly pap and breast exam with dr levoie--dt in 2001 at gyn-no colonoscopy  and will be due next year.  History of Present Illness: CPX    Current Allergies: ! AUGMENTIN  Past Medical History:    Reviewed history from 09/16/2006 and no changes required:       Asthma       Hypothyroidism       HSV  Past Surgical History:    Reviewed history from 11/04/2006 and no changes required:       Denies surgical history   Family History:    Reviewed history from 09/04/2006 and no changes required:       Family History Hypertension       Family History of Melanoma  Social History:    Reviewed history from 09/04/2006 and no changes required:       Married       Former Smoker       Alcohol use-yes    Review of Systems  The patient denies anorexia, fever, weight loss, weight gain, vision loss, decreased hearing, hoarseness, chest pain, syncope, dyspnea on exertion, peripheral edema, prolonged cough, headaches, hemoptysis, abdominal pain, melena, hematochezia, severe indigestion/heartburn, hematuria, incontinence, genital sores, muscle weakness, suspicious skin lesions, transient blindness, difficulty walking, depression, unusual weight change, abnormal bleeding, enlarged lymph nodes, angioedema, and breast masses.     Physical Exam  General:     Well-developed,well-nourished,in no acute distress; alert,appropriate and cooperative throughout examination Head:     Normocephalic and atraumatic without obvious abnormalities. No apparent alopecia or balding. Eyes:     No corneal or conjunctival inflammation noted. EOMI. Perrla.  Funduscopic exam benign, without hemorrhages, exudates or papilledema. Vision grossly normal. Ears:     External ear exam shows no significant lesions or deformities.  Otoscopic examination reveals clear canals, tympanic membranes are intact bilaterally without bulging, retraction, inflammation or discharge. Hearing is grossly normal bilaterally. Nose:     External nasal examination shows no deformity or inflammation. Nasal mucosa are pink and moist without lesions or exudates. Mouth:     posterior lymphoid hypertrophy and postnasal drip.   Neck:     No deformities, masses, or tenderness noted. Chest Wall:     No deformities, masses, or tenderness noted. Lungs:     Normal respiratory effort, chest expands symmetrically. Lungs are clear to auscultation, no crackles or wheezes. Heart:     Normal rate and regular rhythm. S1 and S2 normal without gallop, murmur, click, rub or other extra sounds. Abdomen:     Bowel sounds positive,abdomen soft and non-tender without masses, organomegaly or hernias noted. Msk:     No deformity or scoliosis noted of thoracic or lumbar spine.   Pulses:     R and L carotid,radial,femoral,dorsalis pedis and posterior tibial pulses are full and equal bilaterally Extremities:     No clubbing, cyanosis, edema, or deformity noted with normal full range of motion of all joints.  Neurologic:     No cranial nerve deficits noted. Station and gait are normal. Plantar reflexes are down-going bilaterally. DTRs are symmetrical throughout. Sensory, motor and coordinative functions appear intact.    Impression & Recommendations:  Problem # 1:  HYPOTHYROIDISM (ICD-244.9)  Her updated medication list for this problem includes:    Synthroid 112 Mcg Tabs (Levothyroxine sodium) .Marland Kitchen... Take 1 tablet by mouth once a day  Labs Reviewed: TSH: 0.27 (07/07/2007)    Chol: 179 (07/07/2007)   HDL: 81.1 (07/07/2007)   LDL: 89 (07/07/2007)   TG: 47 (07/07/2007)   Problem # 2:  ACNE  ROSACEA (ICD-695.3)  Problem # 3:  THYROID NODULE (ICD-241.0) repreat [name] for growth monitering Orders: Radiology Referral (Radiology)   Problem # 4:  OSTEOPENIA (ICD-733.90)  Complete Medication List: 1)  Synthroid 112 Mcg Tabs (Levothyroxine sodium) .... Take 1 tablet by mouth once a day 2)  Singulair 10 Mg Tabs (Montelukast sodium) .... Take 1 tablet by mouth once a day 3)  Astelin 137 Mcg/spray Soln (Azelastine hcl) .... As needed 4)  Zyrtec Allergy 10 Mg Tabs (Cetirizine hcl) .... As needed 5)  Atrovent Hfa 17 Mcg/act Aers (Ipratropium bromide hfa) .... Two puff by mouth q 4 hours prn 6)  Minocin 50 Mg Caps (Minocycline hcl) .... One by mouth daily 7)  Duac Cs 1-5 % Kit (Clindamycin-benzoyl per-cleans) .... Apply to face as directed   Patient Instructions: 1)  Please schedule a follow-up appointment in 1 month.   Prescriptions: DUAC CS 1-5 %  KIT (CLINDAMYCIN-BENZOYL PER-CLEANS) apply to face as directed  #1 kit x 11   Entered and Authorized by:   Stacie Glaze MD   Signed by:   Stacie Glaze MD on 07/14/2007   Method used:   Electronically sent to ...       [facility]       [address]       [place], [place]  [address]       Ph: [phone] or [phone]       Fax: [phone]   RxID:   [id number] MINOCIN 50 MG  CAPS (MINOCYCLINE HCL) one by mouth daily  #30 x 11   Entered and Authorized by:   Stacie Glaze MD   Signed by:   Stacie Glaze MD on 07/14/2007   Method used:   Electronically sent to ...       [facility]       [address]       [place], [place]  [id number]       Ph: [phone] or [phone]       Fax: [phone]   RxID:   [id number]  ]

## 2010-03-05 NOTE — Progress Notes (Signed)
Summary: would like referal to have thyroid ultrasound done  Phone Note Call from Patient Call back at [phone]   Caller: patient live Call For: Lovell Sheehan Summary of Call: patient wants an ultrasound of her thyroid.  She called the imaging place and they need a referal.  Please set this test up and advise patient.  Initial call taken by: Roselle Locus,  March 01, 2007 10:12 AM  Follow-up for Phone Call        Pt. would like to have [name] before office visit next week.  Do you want to schedule this? Follow-up by: Lynann Beaver CMA,  March 01, 2007 10:38 AM  Additional Follow-up for Phone Call Additional follow up Details #1::        nodular thyroid   may call the [facility] and get a repeat US Additional Follow-up by: Stacie Glaze MD,  March 01, 2007 1:24 PM    Additional Follow-up for Phone Call Additional follow up Details #2::    sent order/office will contact patient([facility]) Follow-up by: Florentina Addison,  March 02, 2007 9:55 AM       Appended Document: would like referal to have thyroid ultrasound done 01/30 @ 1:30

## 2010-03-05 NOTE — Assessment & Plan Note (Signed)
Summary: sinus infection/dm   Vital Signs:  Patient Profile:   55 Years Old Female Height:     65 inches Weight:      125 pounds Temp:     98.5 degrees F oral BP sitting:   122 / 78  (left arm) Cuff size:   regular  Vitals Entered By: Raechel Ache, RN (March 09, 2008 4:09 PM)                 Chief Complaint:  C/o bad headache, runny nose, and tired.Marland Kitchen  History of Present Illness: 55 year old patient history of asthma, allergic rhinitis as well as treated hypo-thyroidism.  She is maintained on multiple maintenance allergy medications for the past few days.  She has had headache, fatigue, and chest congestion, and rhinorrhea.  There is been no fever or any purulent drainage.  She is concerned about clinical worsening and that should be leaving for a [place] trip soon.  there's been no shortness of breath or wheezing or any sputum production    Current Allergies: ! AUGMENTIN      Physical Exam  General:     Well-developed,well-nourished,in no acute distress; alert,appropriate and cooperative throughout examination Head:     Normocephalic and atraumatic without obvious abnormalities. No apparent alopecia or balding. Eyes:     No corneal or conjunctival inflammation noted. EOMI. Perrla. Funduscopic exam benign, without hemorrhages, exudates or papilledema. Vision grossly normal. Ears:     External ear exam shows no significant lesions or deformities.  Otoscopic examination reveals clear canals, tympanic membranes are intact bilaterally without bulging, retraction, inflammation or discharge. Hearing is grossly normal bilaterally. Mouth:     Oral mucosa and oropharynx without lesions or exudates.  Teeth in good repair. Neck:     No deformities, masses, or tenderness noted. Lungs:     Normal respiratory effort, chest expands symmetrically. Lungs are clear to auscultation, no crackles or wheezes. Heart:     Normal rate and regular rhythm. S1 and S2 normal without  gallop, murmur, click, rub or other extra sounds.    Impression & Recommendations:  Problem # 1:  URI (ICD-465.9)  Her updated medication list for this problem includes:    Zyrtec Allergy 10 Mg Tabs (Cetirizine hcl) .Marland Kitchen... As needed    Allanvan-dm 12.07-03-23 Mg/83ml Susp (Phenyleph tan-pyril tan-dm tan) .Marland Kitchen..Marland Kitchen Two tsp by mouth two times a day  ( may substitue similar product)    Celebrex 200 Mg Caps (Celecoxib) ..... One by mouth daily   Problem # 2:  ASTHMA (ICD-493.90)  Her updated medication list for this problem includes:    Singulair 10 Mg Tabs (Montelukast sodium) .Marland Kitchen... Take 1 tablet by mouth once a day    Atrovent Hfa 17 Mcg/act Aers (Ipratropium bromide hfa) .Marland Kitchen..Marland Kitchen Two puff by mouth q 4 hours prn    Methylprednisolone 4 Mg Tabs (Methylprednisolone) .Marland KitchenMarland KitchenMarland KitchenMarland Kitchen 4 for 2 days then 3 for 2 days then 2 for 2 days then 1 for 2 days   Complete Medication List: 1)  Synthroid 112 Mcg Tabs (Levothyroxine sodium) .... Take 1 tablet by mouth once a day 2)  Singulair 10 Mg Tabs (Montelukast sodium) .... Take 1 tablet by mouth once a day 3)  Astelin 137 Mcg/spray Soln (Azelastine hcl) .... As needed 4)  Zyrtec Allergy 10 Mg Tabs (Cetirizine hcl) .... As needed 5)  Atrovent Hfa 17 Mcg/act Aers (Ipratropium bromide hfa) .... Two puff by mouth q 4 hours prn 6)  Minocin 50 Mg Caps (Minocycline  hcl) .... One by mouth daily 7)  Duac Cs 1-5 % Kit (Clindamycin-benzoyl per-cleans) .... Apply to face as directed 8)  Methylprednisolone 4 Mg Tabs (Methylprednisolone) .... 4 for 2 days then 3 for 2 days then 2 for 2 days then 1 for 2 days 9)  Clarithromycin 500 Mg Xr24h-tab (Clarithromycin) .... Two by mouth daily for 7 days 10)  Allanvan-dm 12.07-03-23 Mg/59ml Susp (Phenyleph tan-pyril tan-dm tan) .... Two tsp by mouth two times a day  ( may substitue similar product) 11)  Celebrex 200 Mg Caps (Celecoxib) .... One by mouth daily   Patient Instructions: 1)  Get plenty of rest, drink lots of clear liquids, and  use Tylenol or Ibuprofen for fever and comfort. Return in 7-10 days if you're not better:sooner if you're feeling worse.   Prescriptions: METHYLPREDNISOLONE 4 MG TABS (METHYLPREDNISOLONE) 4 for 2 days then 3 for 2 days then 2 for 2 days then 1 for 2 days  #20 x 0   Entered and Authorized by:   Gordy Savers  MD   Signed by:   Gordy Savers  MD on 03/09/2008   Method used:   Print then Give to Patient   RxID:   [id number]

## 2010-03-05 NOTE — Assessment & Plan Note (Signed)
Summary: sinus inf/NO BETTER/PS   Vital Signs:  Patient Profile:   55 Years Old Female Height:     65 inches Weight:      118 pounds Temp:     98.5 degrees F oral Pulse rate:   72 / minute Resp:     12 per minute BP sitting:   120 / 82  (left arm)  Vitals Entered By: Willy Eddy, LPN (November 04, 2006 11:45 AM)                 Chief Complaint:  saw Dr Cato Mulligan last week and dx with viral infection/no better/thinks it is sinusitis like sx and thinks she needs anitibiotic.  History of Present Illness: Ear pain and pressure in sinuses. GI upset with nausia and gas pains No increased flatus. Mod diarrhea. Stressors are increased.  Current Allergies: ! AUGMENTIN  Past Medical History:    Reviewed history from 09/16/2006 and no changes required:       Asthma       Hypothyroidism       HSV  Past Surgical History:    Denies surgical history   Family History:    Reviewed history from 09/04/2006 and no changes required:       Family History Hypertension       Family History of Melanoma  Social History:    Reviewed history from 09/04/2006 and no changes required:       Married       Former Smoker       Alcohol use-yes     Physical Exam  General:     pale.   Head:     Normocephalic and atraumatic without obvious abnormalities. No apparent alopecia or balding. Nose:     nasal dischargemucosal pallor.   Mouth:     pharyngeal erythema.   Neck:     No deformities, masses, or tenderness noted. Lungs:     normal respiratory effort, R wheezes, and L wheezes.   Heart:     Normal rate and regular rhythm. S1 and S2 normal without gallop, murmur, click, rub or other extra sounds. Abdomen:     soft, non-tender, and normal bowel sounds.      Impression & Recommendations:  Problem # 1:  URI (ICD-465.9) Viral ilness Her updated medication list for this problem includes:    Zyrtec Allergy 10 Mg Tabs (Cetirizine hcl) .Marland Kitchen... As needed    Allerx Df 4-2.5 &  8-2.5 Mg Misc (Chlorpheniramine-methscop) .Marland Kitchen..Marland Kitchen Two times a day acute bacterial sinusitis on allergies Depo 80 Avelox for 5 days AllerX  Instructed on symptomatic treatment. Call if symptoms persist or worsen.   Problem # 2:  ASTHMA (ICD-493.90) stable Her updated medication list for this problem includes:    Singulair 10 Mg Tabs (Montelukast sodium) .Marland Kitchen... Take 1 tablet by mouth once a day    Atrovent Hfa 17 Mcg/act Aers (Ipratropium bromide hfa) .Marland Kitchen..Marland Kitchen Two puff by mouth q 4 hours prn  Orders: Depo- Medrol 80mg  (J1040) Admin of Therapeutic Inj  intramuscular or subcutaneous (16109) depo medrol 120/1.5 ml IM in rt gluteal lot [id number] b exp.11/09  Complete Medication List: 1)  Synthroid 112 Mcg Tabs (Levothyroxine sodium) .... Take 1 tablet by mouth once a day 2)  Singulair 10 Mg Tabs (Montelukast sodium) .... Take 1 tablet by mouth once a day 3)  Astelin 137 Mcg/spray Soln (Azelastine hcl) .... As needed 4)  Zyrtec Allergy 10 Mg Tabs (Cetirizine hcl) .... As needed 5)  Atrovent Hfa 17 Mcg/act Aers (Ipratropium bromide hfa) .... Two puff by mouth q 4 hours prn 6)  Allerx Df 4-2.5 & 8-2.5 Mg Misc (Chlorpheniramine-methscop) .... Two times a day 7)  Zithromax Z-pak 250 Mg Tabs (Azithromycin) .... Take as directed   Patient Instructions: 1)  Vit D 1000IU twice a day AND  TUMS four tabs a day    ]

## 2010-03-05 NOTE — Assessment & Plan Note (Signed)
Summary: sore throat headache/mhf ok per bonnye/mhf   Vital Signs:  Patient Profile:   55 Years Old Female Height:     65 inches Temp:     98.4 degrees F oral Pulse rate:   76 / minute Resp:     14 per minute BP sitting:   130 / 80  (left arm)  Vitals Entered By: Willy Eddy, LPN (November 25, 2007 8:40 AM)                 Chief Complaint:  c/o sorethroat and sinus congestion and URI symptoms.  History of Present Illness:  URI Symptoms      This is a 55 year old woman who presents with URI symptoms.  HX OF ASTHMA WITH URISYMPTOMS,.  The patient reports nasal congestion, clear nasal discharge, and dry cough.  Associated symptoms include wheezing.  The patient also reports itchy watery eyes, itchy throat, sneezing, headache, and muscle aches.      Current Allergies: ! AUGMENTIN  Past Medical History:    Reviewed history from 09/16/2006 and no changes required:       Asthma       Hypothyroidism       HSV  Past Surgical History:    Reviewed history from 11/04/2006 and no changes required:       Denies surgical history   Family History:    Reviewed history from 09/04/2006 and no changes required:       Family History Hypertension       Family History of Melanoma  Social History:    Reviewed history from 09/04/2006 and no changes required:       Married       Former Smoker       Alcohol use-yes    Review of Systems       The patient complains of hoarseness and prolonged cough.  The patient denies anorexia, fever, weight loss, weight gain, vision loss, decreased hearing, chest pain, syncope, dyspnea on exertion, peripheral edema, headaches, hemoptysis, abdominal pain, melena, hematochezia, severe indigestion/heartburn, hematuria, incontinence, genital sores, muscle weakness, suspicious skin lesions, transient blindness, difficulty walking, depression, unusual weight change, abnormal bleeding, enlarged lymph nodes, angioedema, and breast masses.         eye  pain   Physical Exam  General:     Well-developed,well-nourished,in no acute distress; alert,appropriate and cooperative throughout examination Head:     Normocephalic and atraumatic without obvious abnormalities. No apparent alopecia or balding. Eyes:     pupils equal and pupils round.   Ears:     R ear normal and L ear normal.   Nose:     no external deformity and no nasal discharge.   Mouth:     pharynx pink and moist and no posterior lymphoid hypertrophy.   Neck:     supple and full ROM.   Lungs:     normal respiratory effort, R wheezes, and L wheezes.   Heart:     normal rate and regular rhythm.      Impression & Recommendations:  Problem # 1:  ACUTE PHARYNGITIS (ICD-462) due to drainage Her updated medication list for this problem includes:    Minocin 50 Mg Caps (Minocycline hcl) ..... One by mouth daily    Clarithromycin 500 Mg Xr24h-tab (Clarithromycin) .Marland Kitchen..Marland Kitchen Two by mouth daily for 7 days    Celebrex 200 Mg Caps (Celecoxib) ..... One by mouth daily Instructed to complete antibiotics and call if not improved in 48 hours.  Problem # 2:  ASTHMA (ICD-493.90) with acute flair Her updated medication list for this problem includes:    Singulair 10 Mg Tabs (Montelukast sodium) .Marland Kitchen... Take 1 tablet by mouth once a day    Atrovent Hfa 17 Mcg/act Aers (Ipratropium bromide hfa) .Marland Kitchen..Marland Kitchen Two puff by mouth q 4 hours prn    Methylprednisolone 4 Mg Tabs (Methylprednisolone) .Marland KitchenMarland KitchenMarland KitchenMarland Kitchen 4 for 2 days then 3 for 2 days then 2 for 2 days then 1 for 2 days   Complete Medication List: 1)  Synthroid 112 Mcg Tabs (Levothyroxine sodium) .... Take 1 tablet by mouth once a day 2)  Singulair 10 Mg Tabs (Montelukast sodium) .... Take 1 tablet by mouth once a day 3)  Astelin 137 Mcg/spray Soln (Azelastine hcl) .... As needed 4)  Zyrtec Allergy 10 Mg Tabs (Cetirizine hcl) .... As needed 5)  Atrovent Hfa 17 Mcg/act Aers (Ipratropium bromide hfa) .... Two puff by mouth q 4 hours prn 6)  Minocin 50 Mg  Caps (Minocycline hcl) .... One by mouth daily 7)  Duac Cs 1-5 % Kit (Clindamycin-benzoyl per-cleans) .... Apply to face as directed 8)  Methylprednisolone 4 Mg Tabs (Methylprednisolone) .... 4 for 2 days then 3 for 2 days then 2 for 2 days then 1 for 2 days 9)  Clarithromycin 500 Mg Xr24h-tab (Clarithromycin) .... Two by mouth daily for 7 days 10)  Allanvan-dm 12.07-03-23 Mg/26ml Susp (Phenyleph tan-pyril tan-dm tan) .... Two tsp by mouth two times a day  ( may substitue similar product) 11)  Celebrex 200 Mg Caps (Celecoxib) .... One by mouth daily   Patient Instructions: 1)  Please schedule a follow-up appointment in 1 month.   Prescriptions: CELEBREX 200 MG CAPS (CELECOXIB) one by mouth daily  #30 x 11   Entered and Authorized by:   Stacie Glaze MD   Signed by:   Stacie Glaze MD on 11/25/2007   Method used:   Electronically to        [facility] (retail)       [address]       [place], [place]  [id number]       Ph: [phone] or [phone]       Fax: [phone]   RxID:   [id number] Sherre Lain 12.07-03-23 MG/5ML SUSP (PHENYLEPH TAN-PYRIL TAN-DM TAN) two tsp by mouth two times a day  ( may substitue similar product)  #6 ox x 0   Entered and Authorized by:   Stacie Glaze MD   Signed by:   Stacie Glaze MD on 11/25/2007   Method used:   Electronically to        [facility] (retail)       [address]       [place], [place]  [id number]       Ph: [phone] or [phone]       Fax: [phone]   RxID:   [id number] CLARITHROMYCIN 500 MG XR24H-TAB (CLARITHROMYCIN) two by mouth daily for 7 days  #14 x 0   Entered and Authorized by:   Stacie Glaze MD   Signed by:   Stacie Glaze MD on 11/25/2007   Method used:   Electronically to        [facility] (retail)       [address]       [place], [place]  [id number]       Ph: [phone] or [phone]       Fax: [phone]  RxID:   [id number] METHYLPREDNISOLONE 4 MG  TABS (METHYLPREDNISOLONE) 4 for 2 days then 3 for 2 days then 2 for 2 days then 1 for 2 days  #20 x 0   Entered and Authorized by:   Stacie Glaze MD   Signed by:   Stacie Glaze MD on 11/25/2007   Method used:   Electronically to        [facility] (retail)       [address]       [place], [place]  [id number]       Ph: [phone] or [phone]       Fax: [phone]   RxID:   [id number]  ]

## 2010-03-07 NOTE — Assessment & Plan Note (Signed)
Summary: PT LEFT BEFORE BEING SEEN!  PATIENT LEFT BEFORE BEING SEEN. She did not disclose the fact that she was leaving until she went to get her co-pay back at front desk. Dottie Nelson-Smith CMA Duncan Dull)  February 05, 2010 10:47 AM     History of Present Illness Visit Type: Initial Visit Primary GI MD: Lina Sar MD Primary Provider: Darryll Capers, MD Chief Complaint: screening colonoscopy  GI Review of Systems      Denies abdominal pain, acid reflux, belching, bloating, chest pain, dysphagia with liquids, dysphagia with solids, heartburn, loss of appetite, nausea, vomiting, vomiting blood, weight loss, and  weight gain.      Reports irritable bowel syndrome.     Denies anal fissure, black tarry stools, change in bowel habit, constipation, diarrhea, diverticulosis, fecal incontinence, heme positive stool, hemorrhoids, jaundice, light color stool, liver problems, rectal bleeding, and  rectal pain. Preventive Screening-Counseling & Management      Drug Use:  no.      Current Medications (verified): 1)  Synthroid 88 Mcg Tabs (Levothyroxine Sodium) .... Take One Tablet By Mouth 2)  Singulair 10 Mg  Tabs (Montelukast Sodium) .... Take 1 Tablet By Mouth Once A Day 3)  Astelin 137 Mcg/spray  Soln (Azelastine Hcl) .... As Needed 4)  Zyrtec Allergy 10 Mg  Tabs (Cetirizine Hcl) .... As Needed 5)  Celebrex 200 Mg Caps (Celecoxib) .... One By Mouth Daily As Needed 6)  Xopenex Hfa 45 Mcg/act Aero (Levalbuterol Tartrate) .Marland Kitchen.. 1-2 Puffs By Mouth Q 4-6 Hours As Needed Exercise/sob/wheeze 7)  B-100 Complex  Tabs (Vitamins-Lipotropics) .... Once Daily 8)  Vitamin C 1000 Mg Tabs (Ascorbic Acid) .... Once Daily 9)  Vitamin D3 1000 Unit Caps (Cholecalciferol) .... Once Daily  Allergies (verified): 1)  ! Augmentin  Past History:  Past Medical History: Asthma Hypothyroidism HSV Irritable Bowel Syndrome  Past Surgical History: Tennis elbow surgery  Family History: Reviewed history from  01/29/2010 and no changes required. Family History Hypertension Family History of Melanoma Family History of Breast Cancer:Grandmother  Social History: Therapist, sports Former Smoker Alcohol use-yes Occupation:  Daily Caffeine Use Illicit Drug Use - no Drug Use:  no  Vital Signs:  Patient profile:   55 year old female Height:      65 inches Weight:      122.50 pounds BMI:     20.46 Pulse rate:   76 / minute Pulse rhythm:   regular BP sitting:   128 / 80  (left arm) Cuff size:   regular  Vitals Entered By: June McMurray CMA Duncan Dull) (February 05, 2010 9:38 AM)   Patient Instructions: 1)  Pt left prior to being seen. She waited for over 1 hour. I called her and left her a message on the phone with my apologies, and promised  to see her in timely manner next time if she reschedules.

## 2010-04-10 ENCOUNTER — Ambulatory Visit (INDEPENDENT_AMBULATORY_CARE_PROVIDER_SITE_OTHER): Payer: BC Managed Care – PPO | Admitting: Internal Medicine

## 2010-04-10 ENCOUNTER — Encounter: Payer: Self-pay | Admitting: Internal Medicine

## 2010-04-10 ENCOUNTER — Other Ambulatory Visit: Payer: Self-pay | Admitting: *Deleted

## 2010-04-10 VITALS — BP 114/80 | HR 80 | Temp 98.9°F | Wt 125.0 lb

## 2010-04-10 DIAGNOSIS — E039 Hypothyroidism, unspecified: Secondary | ICD-10-CM

## 2010-04-10 DIAGNOSIS — N959 Unspecified menopausal and perimenopausal disorder: Secondary | ICD-10-CM

## 2010-04-10 LAB — T3, FREE: T3, Free: 3.2 pg/mL (ref 2.3–4.2)

## 2010-04-10 LAB — T4, FREE: Free T4: 0.89 ng/dL (ref 0.60–1.60)

## 2010-04-10 LAB — TSH: TSH: 2.07 u[IU]/mL (ref 0.35–5.50)

## 2010-04-10 LAB — FOLLICLE STIMULATING HORMONE: FSH: 82.6 m[IU]/mL

## 2010-04-10 LAB — LUTEINIZING HORMONE: LH: 27.95 m[IU]/mL

## 2010-04-10 MED ORDER — CELECOXIB 200 MG PO CAPS: 200.0000 mg | ORAL_CAPSULE | Freq: Every day | ORAL | Status: DC

## 2010-04-10 NOTE — Progress Notes (Signed)
Subjective:    Patient ID: Tasha Jackson, female    DOB: 04/21/1955, 55 y.o.   MRN: [id number]  HPI  patient is a 55 year old white female who presents with multiple physical complaints.   of recent note is that she was screened at work and found her TSH was suppressed so she was referred to an endocrinologist to begin titrating her off her thyroid medications to see if indeed she was truly hypothyroid.  She has a history of a thyroid nodule and according to her and one of the reasons for why she was taking thyroid medicine was to suppress the growth of the nodule. Since she has begun to taper she has noticed a lot of physical complaints myalgias muscle aches and pains ears ringing, emotional lability recurrent heart palpitations lack of energy.   The patient treated for perimenopausal disorder with Cymbalta but was intolerant of the medication to 2 GI complaints.   Review of Systems  Constitutional: Negative.  Negative for activity change, appetite change and fatigue.       [ She is noticing constitutional changes of fatigue tiredness muscle aches and pains and night sweats HENT: Negative for ear pain, congestion, neck pain, postnasal drip and sinus pressure.   Eyes: Negative for redness and visual disturbance.  Respiratory: Negative for cough, shortness of breath and wheezing.   Gastrointestinal: Negative for abdominal pain and abdominal distention.  Genitourinary: Negative for dysuria, frequency and menstrual problem.  Musculoskeletal: Negative for myalgias, joint swelling and arthralgias.  Skin: Negative for rash and wound.  Neurological: Negative for dizziness, weakness and headaches.  Hematological: Negative for adenopathy. Does not bruise/bleed easily.  Psychiatric/Behavioral: Negative for sleep disturbance and decreased concentration.       Past Medical History  Diagnosis Date  . Asthma   . Hypothyroidism   . HSV (herpes simplex virus) infection   . IBS (irritable bowel  syndrome)    Past Surgical History  Procedure Date  . Tennis elbow surgery     reports that she has quit smoking. She does not have any smokeless tobacco history on file. She reports that she drinks alcohol. Her drug history not on file. family history includes Breast cancer in her maternal grandmother; Hypertension in her father; and Melanoma in her mother. Allergies  Allergen Reactions  . WUJ:WJXBJYNWGNF+AOZHYQMVH+QIONGEXBMW Acid+Aspartame     REACTION: GI upset       Objective:   Physical Exam  Constitutional: She is oriented to person, place, and time. She appears well-developed and well-nourished. No distress.  HENT:  Head: Normocephalic and atraumatic.  Right Ear: External ear normal.  Left Ear: External ear normal.  Nose: Nose normal.  Mouth/Throat: Oropharynx is clear and moist.  Eyes: Conjunctivae and EOM are normal. Pupils are equal, round, and reactive to light.  Neck: Normal range of motion. Neck supple. No JVD present. No tracheal deviation present. No thyromegaly present.  Cardiovascular: Normal rate, regular rhythm, normal heart sounds and intact distal pulses.   No murmur heard. Pulmonary/Chest: Effort normal and breath sounds normal. She has no wheezes. She exhibits no tenderness.  Abdominal: Soft. Bowel sounds are normal.  Musculoskeletal: Normal range of motion. She exhibits no edema and no tenderness.  Lymphadenopathy:    She has no cervical adenopathy.  Neurological: She is alert and oriented to person, place, and time. She has normal reflexes. No cranial nerve deficit.  Skin: Skin is warm and dry. She is not diaphoretic.  Psychiatric: She has a normal mood and affect. Her  behavior is normal.          Assessment & Plan:

## 2010-04-10 NOTE — Assessment & Plan Note (Signed)
She was placed on thyroid suppression for nodule as well as symptomatic hypothroidism She has been reduced to 25 mcg of synthroid in a taper. We will moniter her TSH T3 and T4

## 2010-04-10 NOTE — Assessment & Plan Note (Signed)
The patient reports that her birth control pills were rapidly stopped in January and since then she has been in [place] and menopause she was tried on Cymbalta for her menopausal symptoms and she developed GI side effects and could not tolerate them she continues to have 3-4 times a day episodes of sweats.

## 2010-04-12 ENCOUNTER — Telehealth: Payer: Self-pay | Admitting: Internal Medicine

## 2010-04-12 DIAGNOSIS — M25519 Pain in unspecified shoulder: Secondary | ICD-10-CM

## 2010-04-12 DIAGNOSIS — E039 Hypothyroidism, unspecified: Secondary | ICD-10-CM

## 2010-04-12 LAB — ESTROGENS, TOTAL: Estrogen: 128 pg/mL

## 2010-04-12 MED ORDER — LEVOTHYROXINE SODIUM 50 MCG PO TABS: 50.0000 ug | ORAL_TABLET | Freq: Every day | ORAL | Status: DC

## 2010-04-12 NOTE — Telephone Encounter (Signed)
There's been a change in your thyroid profile since we last checked it showing that your T4 levels have dropped and her TSH has increased since we're trying to suppress nodule growth I would recommend that we increase his Synthroid from the current dose that you have been titrated back to to 50 mcg

## 2010-04-12 NOTE — Telephone Encounter (Signed)
Triage vm-----requesting lab results and directions.       Tasha Jackson

## 2010-04-12 NOTE — Telephone Encounter (Signed)
Please advise 

## 2010-04-20 ENCOUNTER — Other Ambulatory Visit: Payer: Self-pay | Admitting: Internal Medicine

## 2010-05-06 ENCOUNTER — Encounter: Payer: Self-pay | Admitting: Internal Medicine

## 2010-05-06 ENCOUNTER — Ambulatory Visit (INDEPENDENT_AMBULATORY_CARE_PROVIDER_SITE_OTHER): Payer: BC Managed Care – PPO | Admitting: Internal Medicine

## 2010-05-06 VITALS — BP 124/90 | HR 76 | Temp 98.2°F | Resp 14 | Ht 65.0 in | Wt 124.0 lb

## 2010-05-06 DIAGNOSIS — N951 Menopausal and female climacteric states: Secondary | ICD-10-CM

## 2010-05-06 MED ORDER — LEVONORGEST-ETH ESTRAD 91-DAY 0.15-0.03 MG PO TABS: 1.0000 | ORAL_TABLET | Freq: Every day | ORAL | Status: DC

## 2010-05-06 NOTE — Progress Notes (Signed)
  Subjective:    Patient ID: Tasha Jackson, female    DOB: 1955-07-15, 55 y.o.   MRN: [id number]  HPI    Review of Systems     Objective:   Physical Exam        Assessment & Plan:

## 2010-06-18 NOTE — Assessment & Plan Note (Signed)
OFFICE VISIT   Tasha Jackson, Tasha Jackson  DOB:  1955-04-29                                       03/02/2007  [id number]#:[id number]   This is a vascular surgery consultation at low level.  The patient is a  55 year old healthy female who has developed spider veins in both lower  extremities.  She requests treatment.  These occurred following 2  pregnancies many years ago.  She has had no bulbous varicosities, deep  vein thromboses, distal edema, or ulceration.  These also are not  painful.  They have become more and more prominent in the medial thighs  and the pretibial areas bilaterally.   EXAM:  Blood pressure 121/77, heart rate is 81.  She has 3+ femoral,  popliteal, dorsalis pedis, and posterior tibial pulses bilaterally.  There is no distal edema in the legs and no hyperpigmentation,  ulceration, or varicosities.  She has spider veins in the medial thighs  and pretibial regions bilaterally.  We will arrange for cutaneous laser  treatments of these by Marisue Ivan in the near future.  She could possibly  require some sclerotherapy in the future as well.   Quita Skye Hart [place], M.D.  Electronically Signed   JDL/MEDQ  D:  03/02/2007  T:  03/03/2007  Job:  [id number]

## 2010-06-21 ENCOUNTER — Ambulatory Visit: Payer: BC Managed Care – PPO | Admitting: Internal Medicine

## 2010-07-10 ENCOUNTER — Encounter: Payer: Self-pay | Admitting: Internal Medicine

## 2010-07-10 ENCOUNTER — Ambulatory Visit (INDEPENDENT_AMBULATORY_CARE_PROVIDER_SITE_OTHER): Payer: BC Managed Care – PPO | Admitting: Internal Medicine

## 2010-07-10 VITALS — BP 130/70 | HR 84 | Temp 98.2°F | Resp 16 | Ht 64.75 in | Wt 128.0 lb

## 2010-07-10 DIAGNOSIS — N959 Unspecified menopausal and perimenopausal disorder: Secondary | ICD-10-CM

## 2010-07-10 DIAGNOSIS — H9319 Tinnitus, unspecified ear: Secondary | ICD-10-CM

## 2010-07-10 DIAGNOSIS — E039 Hypothyroidism, unspecified: Secondary | ICD-10-CM

## 2010-07-10 DIAGNOSIS — J45909 Unspecified asthma, uncomplicated: Secondary | ICD-10-CM

## 2010-07-10 DIAGNOSIS — L909 Atrophic disorder of skin, unspecified: Secondary | ICD-10-CM

## 2010-07-10 DIAGNOSIS — L918 Other hypertrophic disorders of the skin: Secondary | ICD-10-CM

## 2010-07-10 DIAGNOSIS — H9313 Tinnitus, bilateral: Secondary | ICD-10-CM

## 2010-07-10 NOTE — Progress Notes (Signed)
Subjective:    Patient ID: Tasha Jackson, female    DOB: [birth year], 55 y.o.   MRN: [id number]  HPI  Patient is a 55 year old white female who presents for followup of hypothyroidism currently on 50 mcg of Synthroid.  She had lab work done in which her thyroid medicine was stopped but she was placed on thyroid for suppression of nodules.  These nodules were felt to be benign but were apparently enlarging in size and she was on Synthroid for suppression.  She has done well on the Synthroid her TSH T3 and T4 are within range.  Her blood pressure is stable her asthma is doing well she has a chief complaint of skin tags underneath her left and right arms that are irritated  Review of Systems  Constitutional: Negative for activity change, appetite change and fatigue.  HENT: Negative for ear pain, congestion, neck pain, postnasal drip and sinus pressure.   Eyes: Negative for redness and visual disturbance.  Respiratory: Negative for cough, shortness of breath and wheezing.   Gastrointestinal: Negative for abdominal pain and abdominal distention.  Genitourinary: Negative for dysuria, frequency and menstrual problem.  Musculoskeletal: Negative for myalgias, joint swelling and arthralgias.  Skin: Negative for rash and wound.  Neurological: Negative for dizziness, weakness and headaches.  Hematological: Negative for adenopathy. Does not bruise/bleed easily.  Psychiatric/Behavioral: Negative for sleep disturbance and decreased concentration.   Past Medical History  Diagnosis Date  . Asthma   . Hypothyroidism   . HSV (herpes simplex virus) infection   . IBS (irritable bowel syndrome)    Past Surgical History  Procedure Date  . Tennis elbow surgery     reports that she has never smoked. She does not have any smokeless tobacco history on file. She reports that she drinks alcohol. She reports that she does not use illicit drugs. family history includes Breast cancer in her maternal  grandmother; Hypertension in her father; and Melanoma in her mother. Allergies  Allergen Reactions  . KGM:WNUUVOZDGUY+QIHKVQQVZ+DGLOVFIEPP Acid+Aspartame     REACTION: GI upset        Objective:   Physical Exam  Constitutional: She is oriented to person, place, and time. She appears well-developed and well-nourished. No distress.  HENT:  Head: Normocephalic and atraumatic.  Right Ear: External ear normal.  Left Ear: External ear normal.  Nose: Nose normal.  Mouth/Throat: Oropharynx is clear and moist.  Eyes: Conjunctivae and EOM are normal. Pupils are equal, round, and reactive to light.  Neck: Normal range of motion. Neck supple. No JVD present. No tracheal deviation present. No thyromegaly present.  Cardiovascular: Normal rate, regular rhythm, normal heart sounds and intact distal pulses.   No murmur heard. Pulmonary/Chest: Effort normal and breath sounds normal. She has no wheezes. She exhibits no tenderness.  Abdominal: Soft. Bowel sounds are normal.  Musculoskeletal: Normal range of motion. She exhibits no edema and no tenderness.  Lymphadenopathy:    She has no cervical adenopathy.  Neurological: She is alert and oriented to person, place, and time. She has normal reflexes. No cranial nerve deficit.  Skin: Skin is warm and dry. She is not diaphoretic.       Patient has skin tags bilaterally under her arms they're irritated and red  Psychiatric: She has a normal mood and affect. Her behavior is normal.          Assessment & Plan:  Stable thyroid replacement no adjustment in dose needed stable blood pressure asthma has been stable.  The patient gave  informed consent arms were prepped bilaterally using sharp surgical scissors the skin tags were removed patient tolerated the procedure well

## 2010-08-10 ENCOUNTER — Other Ambulatory Visit: Payer: Self-pay | Admitting: Internal Medicine

## 2010-08-19 ENCOUNTER — Telehealth: Payer: Self-pay | Admitting: *Deleted

## 2010-08-19 MED ORDER — DICYCLOMINE HCL 20 MG PO TABS: 20.0000 mg | ORAL_TABLET | Freq: Three times a day (TID) | ORAL | Status: DC | PRN

## 2010-08-19 NOTE — Telephone Encounter (Signed)
Pt is having a bout of IBS, and wants Rx to take called to [facility].  Diarrhea x 3 weeks.

## 2010-08-19 NOTE — Telephone Encounter (Signed)
Per dr Lovell Sheehan may have bentyl20  Tid for 10 days

## 2010-08-19 NOTE — Telephone Encounter (Signed)
Pt informed. Left message on machine

## 2010-08-27 ENCOUNTER — Telehealth: Payer: Self-pay | Admitting: *Deleted

## 2010-08-27 NOTE — Telephone Encounter (Signed)
To deb.

## 2010-08-27 NOTE — Telephone Encounter (Signed)
The precipitating factor may be the long menstrual cycles rather than just the IBS Sometimes you have to take hormones to cycle you in these cases I would be more comfortable if the gynecologist handled the hormone manipulation. Ask her to get into the gynecologist tomorrow or the next day... if not we will try to cycle her

## 2010-08-27 NOTE — Telephone Encounter (Signed)
Pt will see GYN ASAP.

## 2010-08-27 NOTE — Telephone Encounter (Signed)
Pt is having a bout of IBS, and a menstrual cycle that has lasted 2 weeks.  Cannot get into the office until July 30, and wants to know if Dr. Lovell Sheehan can do anything to help.  Tasha Jackson

## 2010-09-02 ENCOUNTER — Encounter: Payer: Self-pay | Admitting: Internal Medicine

## 2010-09-02 ENCOUNTER — Ambulatory Visit (INDEPENDENT_AMBULATORY_CARE_PROVIDER_SITE_OTHER): Payer: BC Managed Care – PPO | Admitting: Internal Medicine

## 2010-09-02 DIAGNOSIS — K589 Irritable bowel syndrome without diarrhea: Secondary | ICD-10-CM

## 2010-09-02 DIAGNOSIS — Z0000 Encounter for general adult medical examination without abnormal findings: Secondary | ICD-10-CM

## 2010-09-02 MED ORDER — HYOSCYAMINE SULFATE CR 0.375 MG PO CP12: 0.3750 mg | ORAL_CAPSULE | Freq: Two times a day (BID) | ORAL | Status: DC | PRN

## 2010-09-02 NOTE — Progress Notes (Signed)
  Subjective:    Patient ID: Tasha Jackson, female    DOB: January 10, 1956, 55 y.o.   MRN: [id number]  HPI GYN requested ultrasound to look at endometrial stripe before discussing HRT supplementation and cycling With the HRT she is currently taking She has been noting extreme vasomotor symptoms and has also had menstrual bleeding The pt has IBS and has not been to for screening She has increased cramps and pain in the lower abdomin The celebrex helps the cramps The bentyl did not help     Review of Systems  Constitutional: Negative for activity change, appetite change and fatigue.  HENT: Negative for ear pain, congestion, neck pain, postnasal drip and sinus pressure.   Eyes: Negative for redness and visual disturbance.  Respiratory: Negative for cough, shortness of breath and wheezing.   Gastrointestinal: Negative for abdominal pain and abdominal distention.  Genitourinary: Negative for dysuria, frequency and menstrual problem.  Musculoskeletal: Negative for myalgias, joint swelling and arthralgias.  Skin: Negative for rash and wound.  Neurological: Negative for dizziness, weakness and headaches.  Hematological: Negative for adenopathy. Does not bruise/bleed easily.  Psychiatric/Behavioral: Negative for sleep disturbance and decreased concentration.       Objective:   Physical Exam  Nursing note and vitals reviewed. Constitutional: She is oriented to person, place, and time. She appears well-developed and well-nourished. No distress.  HENT:  Head: Normocephalic and atraumatic.  Right Ear: External ear normal.  Left Ear: External ear normal.  Nose: Nose normal.  Mouth/Throat: Oropharynx is clear and moist.  Eyes: Conjunctivae and EOM are normal. Pupils are equal, round, and reactive to light.  Neck: Normal range of motion. Neck supple. No JVD present. No tracheal deviation present. No thyromegaly present.  Cardiovascular: Normal rate, regular rhythm, normal heart sounds and intact  distal pulses.   No murmur heard. Pulmonary/Chest: Effort normal and breath sounds normal. She has no wheezes. She exhibits no tenderness.  Abdominal: Soft. Bowel sounds are normal.  Musculoskeletal: Normal range of motion. She exhibits no edema and no tenderness.  Lymphadenopathy:    She has no cervical adenopathy.  Neurological: She is alert and oriented to person, place, and time. She has normal reflexes. No cranial nerve deficit.  Skin: Skin is warm and dry. She is not diaphoretic.  Psychiatric: She has a normal mood and affect. Her behavior is normal.          Assessment & Plan:  The patient is due screening colonoscopy she is 55 years of age she also has a working diagnosis of irritable bowel which colonoscopy would be indicated.  There is no family history of colon cancer  We will try levsin BID No problem-specific assessment & plan notes found for this encounter.

## 2010-09-11 ENCOUNTER — Other Ambulatory Visit: Payer: Self-pay | Admitting: Obstetrics & Gynecology

## 2010-09-11 DIAGNOSIS — Z1231 Encounter for screening mammogram for malignant neoplasm of breast: Secondary | ICD-10-CM

## 2010-09-12 ENCOUNTER — Encounter: Payer: Self-pay | Admitting: Gastroenterology

## 2010-09-25 ENCOUNTER — Ambulatory Visit
Admission: RE | Admit: 2010-09-25 | Discharge: 2010-09-25 | Disposition: A | Discharge status: Home / Self Care | Payer: BC Managed Care – PPO | Source: Ambulatory Visit | Attending: Obstetrics & Gynecology | Admitting: Obstetrics & Gynecology

## 2010-09-25 DIAGNOSIS — Z1231 Encounter for screening mammogram for malignant neoplasm of breast: Secondary | ICD-10-CM

## 2010-10-08 ENCOUNTER — Ambulatory Visit: Payer: BC Managed Care – PPO | Admitting: Gastroenterology

## 2010-10-16 ENCOUNTER — Encounter: Payer: BC Managed Care – PPO | Admitting: Internal Medicine

## 2010-10-22 ENCOUNTER — Ambulatory Visit: Payer: BC Managed Care – PPO | Admitting: Gastroenterology

## 2010-11-05 ENCOUNTER — Encounter: Payer: Self-pay | Admitting: Gastroenterology

## 2010-12-03 ENCOUNTER — Encounter: Payer: Self-pay | Admitting: Gastroenterology

## 2010-12-03 ENCOUNTER — Other Ambulatory Visit (INDEPENDENT_AMBULATORY_CARE_PROVIDER_SITE_OTHER): Payer: BC Managed Care – PPO

## 2010-12-03 ENCOUNTER — Other Ambulatory Visit: Payer: Self-pay | Admitting: Gastroenterology

## 2010-12-03 ENCOUNTER — Ambulatory Visit (INDEPENDENT_AMBULATORY_CARE_PROVIDER_SITE_OTHER): Payer: BC Managed Care – PPO | Admitting: Gastroenterology

## 2010-12-03 VITALS — BP 118/64 | HR 80 | Ht 65.0 in | Wt 130.0 lb

## 2010-12-03 DIAGNOSIS — R197 Diarrhea, unspecified: Secondary | ICD-10-CM

## 2010-12-03 DIAGNOSIS — K589 Irritable bowel syndrome without diarrhea: Secondary | ICD-10-CM

## 2010-12-03 DIAGNOSIS — K529 Noninfective gastroenteritis and colitis, unspecified: Secondary | ICD-10-CM

## 2010-12-03 LAB — IBC PANEL
Iron: 215 ug/dL — ABNORMAL HIGH (ref 42–145)
Saturation Ratios: 36.8 % (ref 20.0–50.0)
Transferrin: 417.1 mg/dL — ABNORMAL HIGH (ref 212.0–360.0)

## 2010-12-03 LAB — FOLATE: Folate: 17.1 ng/mL (ref >5.9)

## 2010-12-03 LAB — FERRITIN: Ferritin: 27.9 ng/mL (ref 10.0–291.0)

## 2010-12-03 LAB — HIGH SENSITIVITY CRP: CRP, High Sensitivity: 2.62 mg/L (ref 0.000–5.000)

## 2010-12-03 LAB — VITAMIN B12: Vitamin B-12: 330 pg/mL (ref 211–911)

## 2010-12-03 NOTE — Progress Notes (Signed)
History of Present Illness:  This is a very pleasant 55 year old Caucasian female with over 10 years of crampy lower abdominal pain and watery diarrhea without melena or hematochezia. The patient has known lactose intolerance, and denies sorbitol or other nondigestiable  Carbohydrate in her diet. She has tried Levsin without improvement, and has had constipation with loperamide. Patient does describe acid reflux but no dysphagia or hepatobiliary complaints. There is no history of weight loss, skin rashes, joint pains, mouth sores, fever or chills. The patient not had previous barium studies or endoscopic exams. Family history is noncontributory.  . I have reviewed this patient's present history, medical and surgical past history, allergies and medications.     ROS: The remainder of the 10 point ROS is negative     Physical Exam: General well developed well nourished patient in no acute distress, appearing her stated age Eyes PERRLA, no icterus, fundoscopic exam per opthamologist Skin no lesions noted Neck supple, no adenopathy, no thyroid enlargement, no tenderness Chest clear to percussion and auscultation Heart no significant murmurs, gallops or rubs noted Abdomen no hepatosplenomegaly masses or tenderness, BS normal.  Rectal inspection normal no fissures, or fistulae noted.  No masses or tenderness on digital exam. Stool guaiac negative. Extremities no acute joint lesions, edema, phlebitis or evidence of cellulitis. Neurologic patient oriented x 3, cranial nerves intact, no focal neurologic deficits noted. Psychological mental status normal and normal affect.  Assessment and plan: Lactose intolerance and probable IBS, rule out inflammatory bowel disease, celiac disease, I prescribed a colitis, collagenous colitis, or chronic parasites. I have ordered lab test including celiac panel, anemia panel, CRP, and is scheduled colonoscopy and endoscopy with propofol sedation. I have started Lialda  2.4 g daily as a trial with a low fiber lactose-free diet. Information concerning avoidance of sorbitol and fructose also provided.  Encounter Diagnosis  Name Primary?  . IBS (irritable bowel syndrome) Yes

## 2010-12-03 NOTE — Patient Instructions (Addendum)
Go to the basement for labs today We have scheduled you for a Colonoscopy/Endoscopy on 12/11/2010 at 4pm You will go to the basement for labs today Lactose Intolerance, Adult Lactose intolerance is when the body is not able to digest lactose, a sugar found in milk and milk products. Lactose intolerance is caused by your body not producing enough of the enzyme lactase. When there is not enough lactase to digest the amount of lactose consumed, discomfort may be felt. Lactose intolerance is not a milk allergy. For most people, lactase deficiency is a condition that develops naturally over time. After about the age of 2, the body begins to produce less lactase. But many people may not experience symptoms until they are much older. CAUSES Things that can cause you to be lactose intolerant include:  Aging.   Being born without the ability to make lactase.   Certain digestive diseases.   Injuries to the small intestine.  SYMPTOMS   Feeling sick to your stomach (nauseous).   Diarrhea.   Cramps.   Bloating.   Gas.  Symptoms usually show up a half hour or 2 hours after eating or drinking products containing lactose. TREATMENT  No treatment can improve the body's ability to produce lactase. However, symptoms can be controlled through diet. A medicine may be given to you to take when you consume lactose-containing foods or drinks. The medicine contains the lactase enzyme, which help the body digest lactose better. HOME CARE INSTRUCTIONS  Eat or drink dairy products as told by your caregiver or dietician.     Take all medicine as directed by your caregiver.   Find lactose-free or lactose-reduced products at your local grocery store.   Talk to your caregiver or dietician to decide if you need any dietary supplements.  The following is the amount of calcium needed from the diet:  19 to 50 years: 1000 mg   Over 50 years: 1200 mg  Calcium and Lactose in Common Foods Non-Dairy Products /  Calcium Content (mg)  Calcium-fortified orange juice, 1 cup / 308 to 344 mg   Sardines, with edible bones, 3 oz / 270 mg   Salmon, canned, with edible bones, 3 oz / 205 mg   Soymilk, fortified, 1 cup / 200 mg   Broccoli (raw), 1 cup / 90 mg   Orange, 1 medium / 50 mg   Pinto beans,  cup / 40 mg   Tuna, canned, 3 oz / 10 mg   Lettuce greens,  cup / 10 mg  Dairy Products / Calcium Content (mg) / Lactose Content (g)  Yogurt, plain, low-fat, 1 cup / 415 mg / 5 g   Milk, reduced fat, 1 cup / 295 mg / 11 g   Swiss cheese, 1 oz / 270 mg / 1 g   Ice cream,  cup / 85 mg / 6 g   Cottage cheese,  cup / 75 mg / 2 to 3 g  SEEK MEDICAL CARE IF: You have no relief from your symptoms. Document Released: 01/20/2005 Document Revised: 10/02/2010 Document Reviewed: 04/19/2010 [facility] Patient Information 2012 [place], [place]. We have given you Lialda samples today

## 2010-12-04 LAB — GLIA (IGA/G) + TTG IGA
Gliadin IgA: 4.2 U/mL (ref <20)
Gliadin IgG: 7.7 U/mL (ref <20)
Tissue Transglutaminase Ab, IgA: 3.6 U/mL (ref <20)

## 2010-12-10 ENCOUNTER — Telehealth: Payer: Self-pay | Admitting: Gastroenterology

## 2010-12-10 NOTE — Telephone Encounter (Signed)
Patient wanted to know about how long she could have clear liquids prior to colonoscopy tomorrow at 4:00.  I advised her until 2:00 pm and all questions were answered.

## 2010-12-11 ENCOUNTER — Ambulatory Visit (AMBULATORY_SURGERY_CENTER): Payer: BC Managed Care – PPO | Admitting: Gastroenterology

## 2010-12-11 ENCOUNTER — Encounter: Payer: Self-pay | Admitting: Gastroenterology

## 2010-12-11 DIAGNOSIS — K589 Irritable bowel syndrome without diarrhea: Secondary | ICD-10-CM

## 2010-12-11 DIAGNOSIS — D1330 Benign neoplasm of unspecified part of small intestine: Secondary | ICD-10-CM

## 2010-12-11 DIAGNOSIS — Z1211 Encounter for screening for malignant neoplasm of colon: Secondary | ICD-10-CM | POA: Insufficient documentation

## 2010-12-11 DIAGNOSIS — Z1210 Encounter for screening for malignant neoplasm of intestinal tract, unspecified: Secondary | ICD-10-CM

## 2010-12-11 DIAGNOSIS — D126 Benign neoplasm of colon, unspecified: Secondary | ICD-10-CM

## 2010-12-11 DIAGNOSIS — K2950 Unspecified chronic gastritis without bleeding: Secondary | ICD-10-CM

## 2010-12-11 DIAGNOSIS — K2940 Chronic atrophic gastritis without bleeding: Secondary | ICD-10-CM

## 2010-12-11 DIAGNOSIS — R197 Diarrhea, unspecified: Secondary | ICD-10-CM

## 2010-12-11 MED ORDER — SODIUM CHLORIDE 0.9 % IV SOLN: 500.0000 mL | INTRAVENOUS | Status: DC

## 2010-12-11 NOTE — Progress Notes (Signed)
Pressure applied to abdomen to reach cecum.  

## 2010-12-11 NOTE — Patient Instructions (Signed)
Please review discharge instructions (blue and green sheets)  Await pathology  Resume normal medciations  Follow up with Dr. Jarold Motto- office appointment November 20th at 10:45 a.m.

## 2010-12-12 ENCOUNTER — Telehealth: Payer: Self-pay | Admitting: *Deleted

## 2010-12-12 DIAGNOSIS — K589 Irritable bowel syndrome without diarrhea: Secondary | ICD-10-CM

## 2010-12-12 DIAGNOSIS — R197 Diarrhea, unspecified: Secondary | ICD-10-CM

## 2010-12-12 DIAGNOSIS — K2940 Chronic atrophic gastritis without bleeding: Secondary | ICD-10-CM

## 2010-12-12 LAB — HELICOBACTER PYLORI SCREEN-BIOPSY: UREASE: NEGATIVE

## 2010-12-12 NOTE — Telephone Encounter (Signed)
Follow up Call- Patient questions:  Do you have a fever, pain , or abdominal swelling? no Pain Score  0 *  Have you tolerated food without any problems? yes  Have you been able to return to your normal activities? yes  Do you have any questions about your discharge instructions: Diet   no Medications  no Follow up visit  no  Do you have questions or concerns about your Care? yes Pt states that her lips are sore and slightly swollen. Instructed pt that it is probably from biting down on her lips while the bite block was in her mouth for endoscopy procedure. Instructed to call back if swelling has not gone down within a few hours, or if she is having any other problems. Pt verbalized understanding. Actions: * If pain score is 4 or above: No action needed, pain <4.

## 2010-12-13 ENCOUNTER — Encounter: Payer: Self-pay | Admitting: Gastroenterology

## 2010-12-18 ENCOUNTER — Ambulatory Visit (INDEPENDENT_AMBULATORY_CARE_PROVIDER_SITE_OTHER): Payer: BC Managed Care – PPO | Admitting: Internal Medicine

## 2010-12-18 ENCOUNTER — Encounter: Payer: Self-pay | Admitting: Internal Medicine

## 2010-12-18 DIAGNOSIS — N943 Premenstrual tension syndrome: Secondary | ICD-10-CM

## 2010-12-18 DIAGNOSIS — H9319 Tinnitus, unspecified ear: Secondary | ICD-10-CM

## 2010-12-18 DIAGNOSIS — Z23 Encounter for immunization: Secondary | ICD-10-CM

## 2010-12-18 DIAGNOSIS — F3281 Premenstrual dysphoric disorder: Secondary | ICD-10-CM

## 2010-12-18 DIAGNOSIS — K589 Irritable bowel syndrome without diarrhea: Secondary | ICD-10-CM

## 2010-12-18 DIAGNOSIS — Z0000 Encounter for general adult medical examination without abnormal findings: Secondary | ICD-10-CM

## 2010-12-18 DIAGNOSIS — H9190 Unspecified hearing loss, unspecified ear: Secondary | ICD-10-CM

## 2010-12-18 MED ORDER — CITALOPRAM HYDROBROMIDE 20 MG PO TABS: 20.0000 mg | ORAL_TABLET | Freq: Every day | ORAL | Status: DC

## 2010-12-18 NOTE — Progress Notes (Signed)
Addended by: Willy Eddy on: 12/18/2010 04:06 PM   Modules accepted: Orders

## 2010-12-18 NOTE — Patient Instructions (Signed)
The patient is instructed to continue all medications as prescribed. Schedule followup with check out clerk upon leaving the clinic  

## 2010-12-18 NOTE — Progress Notes (Signed)
Subjective:    Patient ID: Tasha Jackson, female    DOB: 05-09-1955, 55 y.o.   MRN: [id number]  HPI The pt presents for CPX She recently had colon and egd for IBS  And was given samples of Iialda samples. The pathology from the colon was normal and the colon report was normal. I believe that her underlying etiology is IBS and her reaction to the lialda helps to confirm this... She has episodes of diarrhea associated with times of high stress. We discussed the use of anxiety lowering drugs She has increased menopausal symptoms and she has only been offered BCP's We reviewed he CPX labs from her employer   Review of Systems  Constitutional: Negative for activity change, appetite change and fatigue.  HENT: Negative for ear pain, congestion, neck pain, postnasal drip and sinus pressure.   Eyes: Negative for redness and visual disturbance.  Respiratory: Negative for cough, shortness of breath and wheezing.   Gastrointestinal: Negative for abdominal pain and abdominal distention.  Genitourinary: Negative for dysuria, frequency and menstrual problem.  Musculoskeletal: Negative for myalgias, joint swelling and arthralgias.  Skin: Negative for rash and wound.  Neurological: Negative for dizziness, weakness and headaches.  Hematological: Negative for adenopathy. Does not bruise/bleed easily.  Psychiatric/Behavioral: Negative for sleep disturbance and decreased concentration.   Past Medical History  Diagnosis Date  . Hypothyroidism   . HSV (herpes simplex virus) infection   . IBS (irritable bowel syndrome)   . Lactose intolerance   . Anxiety   . IBS (irritable bowel syndrome)   . Allergy     TAKE 3 PILLS ALL YEAR ROUND  . Asthma     during exercise  . Osteopenia    Past Surgical History  Procedure Date  . Elbow surgery     Tennis elbow     reports that she has quit smoking. She has never used smokeless tobacco. She reports that she drinks alcohol. She reports that she does not use  illicit drugs. family history includes Breast cancer in her maternal grandmother; Heart disease in her father; Hypertension in her father; and Melanoma in her mother.  There is no history of Colon cancer. Allergies  Allergen Reactions  . WUJ:WJXBJYNWGNF+AOZHYQMVH+QIONGEXBMW Acid+Aspartame     REACTION: GI upset       Objective:   Physical Exam  Vitals reviewed. Constitutional: She is oriented to person, place, and time. She appears well-developed and well-nourished. No distress.  HENT:  Head: Normocephalic and atraumatic.  Right Ear: External ear normal.  Left Ear: External ear normal.  Nose: Nose normal.  Mouth/Throat: Oropharynx is clear and moist.  Eyes: Conjunctivae and EOM are normal. Pupils are equal, round, and reactive to light.  Neck: Normal range of motion. Neck supple. No JVD present. No tracheal deviation present. No thyromegaly present.  Cardiovascular: Normal rate, regular rhythm, normal heart sounds and intact distal pulses.   No murmur heard. Pulmonary/Chest: Effort normal and breath sounds normal. She has no wheezes. She exhibits no tenderness.  Abdominal: Soft. Bowel sounds are normal.  Musculoskeletal: Normal range of motion. She exhibits no edema and no tenderness.  Lymphadenopathy:    She has no cervical adenopathy.  Neurological: She is alert and oriented to person, place, and time. She has normal reflexes. No cranial nerve deficit.  Skin: Skin is warm and dry. She is not diaphoretic.  Psychiatric: She has a normal mood and affect. Her behavior is normal.          Assessment & Plan:  CPX  This is a routine physical examination for this healthy  Female. Reviewed all health maintenance protocols including mammography colonoscopy bone density and reviewed appropriate screening labs. Her immunization history was reviewed as well as her current medications and allergies refills of her chronic medications were given and the plan for yearly health maintenance was  discussed all orders and referrals were made as appropriate. Menopause on BCP's with a change for memopausal symptoms We discussed the use of a SSRI in PMDDand IBS. We will give her a trial of celexa Monitoring thyroid, lipids and CPX labs ALL NORMAL  HDL 90! Refer to audiology for hearing loss and tinnitus. Follow up 2 months

## 2010-12-18 NOTE — Progress Notes (Signed)
Addended by: Stacie Glaze MD E on: 12/18/2010 04:00 PM   Modules accepted: Orders

## 2010-12-20 ENCOUNTER — Other Ambulatory Visit: Payer: Self-pay | Admitting: *Deleted

## 2010-12-20 MED ORDER — LEVALBUTEROL TARTRATE 45 MCG/ACT IN AERO: 1.0000 | INHALATION_SPRAY | RESPIRATORY_TRACT | Status: DC | PRN

## 2010-12-24 ENCOUNTER — Ambulatory Visit: Payer: BC Managed Care – PPO | Admitting: Gastroenterology

## 2010-12-31 ENCOUNTER — Ambulatory Visit: Payer: BC Managed Care – PPO | Admitting: Gastroenterology

## 2010-12-31 ENCOUNTER — Telehealth: Payer: Self-pay | Admitting: Internal Medicine

## 2010-12-31 MED ORDER — CITALOPRAM HYDROBROMIDE 10 MG PO TABS: 10.0000 mg | ORAL_TABLET | Freq: Every day | ORAL | Status: DC

## 2010-12-31 NOTE — Telephone Encounter (Signed)
Pt informed

## 2010-12-31 NOTE — Telephone Encounter (Signed)
Ok to change per dr Lovell Sheehan

## 2010-12-31 NOTE — Telephone Encounter (Signed)
Pt is requesting a decrease in citalopram (CELEXA) 20 MG tablet. Pt said that the 20 mg is to strong for her and it knocks her out

## 2011-01-03 ENCOUNTER — Ambulatory Visit: Payer: BC Managed Care – PPO | Admitting: Gastroenterology

## 2011-02-19 ENCOUNTER — Encounter: Payer: Self-pay | Admitting: Internal Medicine

## 2011-02-19 ENCOUNTER — Ambulatory Visit (INDEPENDENT_AMBULATORY_CARE_PROVIDER_SITE_OTHER): Payer: BC Managed Care – PPO | Admitting: Internal Medicine

## 2011-02-19 VITALS — BP 130/74 | HR 72 | Temp 98.3°F | Resp 14 | Ht 65.0 in | Wt 125.0 lb

## 2011-02-19 DIAGNOSIS — F419 Anxiety disorder, unspecified: Secondary | ICD-10-CM

## 2011-02-19 DIAGNOSIS — F411 Generalized anxiety disorder: Secondary | ICD-10-CM

## 2011-02-19 DIAGNOSIS — J45909 Unspecified asthma, uncomplicated: Secondary | ICD-10-CM

## 2011-02-19 DIAGNOSIS — T887XXA Unspecified adverse effect of drug or medicament, initial encounter: Secondary | ICD-10-CM

## 2011-02-19 MED ORDER — ALPRAZOLAM 0.25 MG PO TABS: 0.2500 mg | ORAL_TABLET | Freq: Three times a day (TID) | ORAL | Status: AC | PRN

## 2011-02-19 NOTE — Progress Notes (Signed)
Subjective:    Patient ID: Tasha Jackson, female    DOB: 12-14-1955, 56 y.o.   MRN: [id number]  HPI Patient is a 56 year old white female who has asthmatic bronchitis and hypothyroidism who presents for treatment of what was felt to be a perimenopausal disorder of anxiety and moderate depression she has tried 2 different SSRIs and they have failed and she believes that she primarily has situational anxiety and that this does not represent a true perimenopausal disorder.  She denies any chest pain shortness of breath PND orthopnea she does have occasional panic situations but no true panic attacks   Review of Systems  Constitutional: Negative for activity change, appetite change and fatigue.  HENT: Negative for ear pain, congestion, neck pain, postnasal drip and sinus pressure.   Eyes: Negative for redness and visual disturbance.  Respiratory: Negative for cough, shortness of breath and wheezing.   Gastrointestinal: Negative for abdominal pain and abdominal distention.  Genitourinary: Negative for dysuria, frequency and menstrual problem.  Musculoskeletal: Negative for myalgias, joint swelling and arthralgias.  Skin: Negative for rash and wound.  Neurological: Negative for dizziness, weakness and headaches.  Hematological: Negative for adenopathy. Does not bruise/bleed easily.  Psychiatric/Behavioral: Negative for sleep disturbance and decreased concentration.   Past Medical History  Diagnosis Date  . Hypothyroidism   . HSV (herpes simplex virus) infection   . IBS (irritable bowel syndrome)   . Lactose intolerance   . Anxiety   . IBS (irritable bowel syndrome)   . Allergy     TAKE 3 PILLS ALL YEAR ROUND  . Asthma     during exercise  . Osteopenia     History   Social History  . Marital Status: Married    Spouse Name: N/A    Number of Children: 2  . Years of Education: N/A   Occupational History  . Merchandising    Social History Main Topics  . Smoking status: Former  Games developer  . Smokeless tobacco: Never Used  . Alcohol Use: Yes     2 drinks a day   . Drug Use: No  . Sexually Active: Yes   Other Topics Concern  . Not on file   Social History Narrative   2 caffeine drinks daily     Past Surgical History  Procedure Date  . Elbow surgery     Tennis elbow     Family History  Problem Relation Age of Onset  . Melanoma Mother   . Hypertension Father   . Heart disease Father   . Breast cancer Maternal Grandmother   . Colon cancer Neg Hx     Allergies  Allergen Reactions  . BJY:NWGNFAOZHYQ+MVHQIONGE+XBMWUXLKGM Acid+Aspartame     REACTION: GI upset    Current Outpatient Prescriptions on File Prior to Visit  Medication Sig Dispense Refill  . Ascorbic Acid (VITAMIN C) 1000 MG tablet Take 1,000 mg by mouth daily.        Marland Kitchen azelastine (ASTELIN) 137 MCG/SPRAY nasal spray TWO SPRAYS IN EACH NOSTRIL TWICE DAILY  30 mL  11  . celecoxib (CELEBREX) 200 MG capsule Take 1 capsule (200 mg total) by mouth daily.  30 capsule  5  . cetirizine (ZYRTEC ALLERGY) 10 MG tablet Take 10 mg by mouth as needed.        . Cholecalciferol (VITAMIN D3) 1000 UNITS CAPS Take by mouth.        . levalbuterol (XOPENEX HFA) 45 MCG/ACT inhaler Inhale 1-2 puffs into the lungs every 4 (four) hours  as needed for wheezing. Every 4-6 hours as needed Exercise/sob/wheeze  1 Inhaler  11  . levonorgestrel-ethinyl estradiol (SEASONALE) 0.15-0.03 MG per tablet Take 1 tablet by mouth daily.  30 tablet  11  . levothyroxine (SYNTHROID) 50 MCG tablet Take 1 tablet (50 mcg total) by mouth daily.  30 tablet  11  . SINGULAIR 10 MG tablet TAKE ONE TABLET AT BEDTIME  30 tablet  11  . Vitamins-Lipotropics (B-100 COMPLEX) TABS Take by mouth daily.          BP 130/74  Pulse 72  Temp 98.3 F (36.8 C)  Resp 14  Ht 5\' 5"  (1.651 m)  Wt 125 lb (56.7 kg)  BMI 20.80 kg/m2        Objective:   Physical Exam  Constitutional: She is oriented to person, place, and time. She appears well-developed  and well-nourished. No distress.  HENT:  Head: Normocephalic and atraumatic.  Right Ear: External ear normal.  Left Ear: External ear normal.  Nose: Nose normal.  Mouth/Throat: Oropharynx is clear and moist.  Eyes: Conjunctivae and EOM are normal. Pupils are equal, round, and reactive to light.  Neck: Normal range of motion. Neck supple. No JVD present. No tracheal deviation present. No thyromegaly present.  Cardiovascular: Normal rate, regular rhythm, normal heart sounds and intact distal pulses.   No murmur heard. Pulmonary/Chest: Effort normal and breath sounds normal. She has no wheezes. She exhibits no tenderness.  Abdominal: Soft. Bowel sounds are normal.  Musculoskeletal: Normal range of motion. She exhibits no edema and no tenderness.  Lymphadenopathy:    She has no cervical adenopathy.  Neurological: She is alert and oriented to person, place, and time. She has normal reflexes. No cranial nerve deficit.  Skin: Skin is warm and dry. She is not diaphoretic.  Psychiatric: She has a normal mood and affect. Her behavior is normal.          Assessment & Plan:  We will do a trial of Xanax 0.25 3 times a day when necessary with instructions not to use more than 60 tablets in one month with a narcotic/drug contract.  We have informed her of the risks of the Xanax use and have asked her to consider counseling for some anxiety and stress training.   I have spent more than 30 minutes examining this patient face-to-face of which over half was spent in counseling

## 2011-02-19 NOTE — Patient Instructions (Signed)
The patient is instructed to continue all medications as prescribed. Schedule followup with check out clerk upon leaving the clinic  

## 2011-04-19 ENCOUNTER — Other Ambulatory Visit: Payer: Self-pay | Admitting: Internal Medicine

## 2011-04-21 ENCOUNTER — Telehealth: Payer: Self-pay | Admitting: Internal Medicine

## 2011-04-21 NOTE — Telephone Encounter (Signed)
Patient called stating that her pharmacy informed her that there has been a recall on her Marcille Blanco and would like to have something different called in. Please assist.

## 2011-04-21 NOTE — Telephone Encounter (Signed)
Per dr Lovell Sheehan- have pharmacy find another with same formula- talked with pharmacist at [facility] and they suggest Portia. Same formulary- that will be used

## 2011-05-21 ENCOUNTER — Encounter: Payer: Self-pay | Admitting: Internal Medicine

## 2011-05-21 ENCOUNTER — Ambulatory Visit (INDEPENDENT_AMBULATORY_CARE_PROVIDER_SITE_OTHER): Payer: BC Managed Care – PPO | Admitting: Internal Medicine

## 2011-05-21 VITALS — BP 130/80 | HR 72 | Temp 98.2°F | Resp 16 | Ht 65.0 in | Wt 125.0 lb

## 2011-05-21 DIAGNOSIS — R002 Palpitations: Secondary | ICD-10-CM

## 2011-05-21 DIAGNOSIS — N959 Unspecified menopausal and perimenopausal disorder: Secondary | ICD-10-CM

## 2011-05-21 DIAGNOSIS — J45909 Unspecified asthma, uncomplicated: Secondary | ICD-10-CM

## 2011-05-21 DIAGNOSIS — J988 Other specified respiratory disorders: Secondary | ICD-10-CM

## 2011-05-21 DIAGNOSIS — J398 Other specified diseases of upper respiratory tract: Secondary | ICD-10-CM

## 2011-05-21 DIAGNOSIS — K589 Irritable bowel syndrome without diarrhea: Secondary | ICD-10-CM

## 2011-05-21 NOTE — Patient Instructions (Signed)
The patient is instructed to continue all medications as prescribed. Schedule followup with check out clerk upon leaving the clinic  

## 2011-06-13 ENCOUNTER — Other Ambulatory Visit: Payer: Self-pay | Admitting: Obstetrics and Gynecology

## 2011-06-16 ENCOUNTER — Other Ambulatory Visit: Payer: Self-pay | Admitting: Internal Medicine

## 2011-06-18 ENCOUNTER — Other Ambulatory Visit: Payer: Self-pay | Admitting: Internal Medicine

## 2011-07-29 ENCOUNTER — Telehealth: Payer: Self-pay | Admitting: Internal Medicine

## 2011-07-29 MED ORDER — ESTAZOLAM 2 MG PO TABS: 2.0000 mg | ORAL_TABLET | Freq: Every day | ORAL | Status: DC

## 2011-07-29 NOTE — Telephone Encounter (Signed)
Pt called back and states she doesn't want another sleep med, requesting a higher dose of xanax

## 2011-07-29 NOTE — Telephone Encounter (Signed)
Caller: Tasha Jackson/Patient; PCP: Darryll Capers; CB#: [phone]; Call regarding Prescription for Xanax 0.25 Mgs 1 PO q HS for sleep. Needing To Take 4 Pills At HS  To Help Sleep Through the Night and Is Wondering If Can Have Refill With A. Stonger Dose. She is out of Medication; She uses [facility] on [address]. [facility] Please call back at [phone].

## 2011-07-29 NOTE — Telephone Encounter (Signed)
Left message on machine Per dr Lovell Sheehan- send in prosam 2mg 

## 2011-07-30 ENCOUNTER — Other Ambulatory Visit: Payer: Self-pay | Admitting: *Deleted

## 2011-07-30 MED ORDER — ALPRAZOLAM 0.5 MG PO TABS: ORAL_TABLET | ORAL | Status: DC

## 2011-07-30 NOTE — Telephone Encounter (Signed)
Will send in .5 and may take 1-2 bid prn anxiety

## 2011-07-30 NOTE — Telephone Encounter (Signed)
Consider klonopin 1.0   For long acting benzo effect

## 2011-07-30 NOTE — Telephone Encounter (Signed)
Per dr Lovell Sheehan- may have xanax 1 mg qhs prn sleep

## 2011-08-18 ENCOUNTER — Other Ambulatory Visit: Payer: Self-pay | Admitting: Internal Medicine

## 2011-08-25 ENCOUNTER — Other Ambulatory Visit: Payer: Self-pay | Admitting: Obstetrics and Gynecology

## 2011-08-25 DIAGNOSIS — Z1231 Encounter for screening mammogram for malignant neoplasm of breast: Secondary | ICD-10-CM

## 2011-09-10 ENCOUNTER — Telehealth: Payer: Self-pay | Admitting: Internal Medicine

## 2011-09-10 MED ORDER — ALPRAZOLAM 0.5 MG PO TABS: ORAL_TABLET | ORAL | Status: DC

## 2011-09-10 NOTE — Telephone Encounter (Signed)
Called in.

## 2011-09-10 NOTE — Telephone Encounter (Signed)
Patient called stating that she would like a refill of her xanax. Please assist.

## 2011-09-24 ENCOUNTER — Ambulatory Visit (INDEPENDENT_AMBULATORY_CARE_PROVIDER_SITE_OTHER): Payer: BC Managed Care – PPO | Admitting: Internal Medicine

## 2011-09-24 ENCOUNTER — Encounter: Payer: Self-pay | Admitting: Internal Medicine

## 2011-09-24 VITALS — BP 110/70 | HR 72 | Temp 98.1°F | Resp 16 | Ht 64.0 in | Wt 122.0 lb

## 2011-09-24 DIAGNOSIS — E039 Hypothyroidism, unspecified: Secondary | ICD-10-CM

## 2011-09-24 DIAGNOSIS — Z0000 Encounter for general adult medical examination without abnormal findings: Secondary | ICD-10-CM

## 2011-09-24 DIAGNOSIS — E785 Hyperlipidemia, unspecified: Secondary | ICD-10-CM

## 2011-09-24 DIAGNOSIS — J45909 Unspecified asthma, uncomplicated: Secondary | ICD-10-CM

## 2011-09-24 LAB — BASIC METABOLIC PANEL
BUN: 13 mg/dL (ref 6–23)
CO2: 30 mEq/L (ref 19–32)
Calcium: 9.7 mg/dL (ref 8.4–10.5)
Chloride: 102 mEq/L (ref 96–112)
Creatinine, Ser: 0.6 mg/dL (ref 0.4–1.2)
GFR: 117.6 mL/min (ref >60.00)
Glucose, Bld: 82 mg/dL (ref 70–99)
Potassium: 4.4 mEq/L (ref 3.5–5.1)
Sodium: 138 mEq/L (ref 135–145)

## 2011-09-24 LAB — CBC WITH DIFFERENTIAL/PLATELET
Basophils Absolute: 0 10*3/uL (ref 0.0–0.1)
Basophils Relative: 0.9 % (ref 0.0–3.0)
Eosinophils Absolute: 0.2 10*3/uL (ref 0.0–0.7)
Eosinophils Relative: 3.7 % (ref 0.0–5.0)
HCT: 40.8 % (ref 36.0–46.0)
Hemoglobin: 13.6 g/dL (ref 12.0–15.0)
Lymphocytes Relative: 29.9 % (ref 12.0–46.0)
Lymphs Abs: 1.4 10*3/uL (ref 0.7–4.0)
MCHC: 33.3 g/dL (ref 30.0–36.0)
MCV: 94.4 fl (ref 78.0–100.0)
Monocytes Absolute: 0.4 10*3/uL (ref 0.1–1.0)
Monocytes Relative: 9.2 % (ref 3.0–12.0)
Neutro Abs: 2.7 10*3/uL (ref 1.4–7.7)
Neutrophils Relative %: 56.3 % (ref 43.0–77.0)
Platelets: 338 10*3/uL (ref 150.0–400.0)
RBC: 4.32 Mil/uL (ref 3.87–5.11)
RDW: 13.1 % (ref 11.5–14.6)
WBC: 4.8 10*3/uL (ref 4.5–10.5)

## 2011-09-24 LAB — LDL CHOLESTEROL, DIRECT: Direct LDL: 96.9 mg/dL

## 2011-09-24 LAB — LIPID PANEL
Cholesterol: 216 mg/dL — ABNORMAL HIGH (ref 0–200)
HDL: 112.2 mg/dL (ref >39.00)
Total CHOL/HDL Ratio: 2
Triglycerides: 62 mg/dL (ref 0.0–149.0)
VLDL: 12.4 mg/dL (ref 0.0–40.0)

## 2011-09-24 LAB — TSH: TSH: 1.41 u[IU]/mL (ref 0.35–5.50)

## 2011-09-24 LAB — T4, FREE: Free T4: 0.91 ng/dL (ref 0.60–1.60)

## 2011-09-24 NOTE — Patient Instructions (Addendum)
The patient is instructed to continue all medications as prescribed. Schedule followup with check out clerk upon leaving the clinic  

## 2011-09-24 NOTE — Progress Notes (Signed)
Subjective:    Patient ID: Tasha Jackson, female    DOB: April 16, 1955, 56 y.o.   MRN: [id number]  HPI Asthma stable allergies stable Using the rescue inhaler with exertion such as tennis or bike ride Stress at work.. Modified with change of reporting structure   Preventive exam today Persistent episodic labyrinthitis    Review of Systems  Constitutional: Negative for activity change, appetite change and fatigue.  HENT: Negative for ear pain, congestion, neck pain, postnasal drip and sinus pressure.   Eyes: Negative for redness and visual disturbance.  Respiratory: Negative for cough, shortness of breath and wheezing.   Gastrointestinal: Negative for abdominal pain and abdominal distention.  Genitourinary: Negative for dysuria, frequency and menstrual problem.  Musculoskeletal: Negative for myalgias, joint swelling and arthralgias.  Skin: Negative for rash and wound.  Neurological: Negative for dizziness, weakness and headaches.  Hematological: Negative for adenopathy. Does not bruise/bleed easily.  Psychiatric/Behavioral: Negative for disturbed wake/sleep cycle and decreased concentration.   Past Medical History  Diagnosis Date  . Hypothyroidism   . HSV (herpes simplex virus) infection   . IBS (irritable bowel syndrome)   . Lactose intolerance   . Anxiety   . IBS (irritable bowel syndrome)   . Allergy     TAKE 3 PILLS ALL YEAR ROUND  . Asthma     during exercise  . Osteopenia     History   Social History  . Marital Status: Married    Spouse Name: N/A    Number of Children: 2  . Years of Education: N/A   Occupational History  . Merchandising    Social History Main Topics  . Smoking status: Former Games developer  . Smokeless tobacco: Never Used  . Alcohol Use: Yes     2 drinks a day   . Drug Use: No  . Sexually Active: Yes   Other Topics Concern  . Not on file   Social History Narrative   2 caffeine drinks daily     Past Surgical History  Procedure Date  .  Elbow surgery     Tennis elbow     Family History  Problem Relation Age of Onset  . Melanoma Mother   . Hypertension Father   . Heart disease Father   . Breast cancer Maternal Grandmother   . Colon cancer Neg Hx     Allergies  Allergen Reactions  . Amoxicillin-Pot Clavulanate     REACTION: GI upset    Current Outpatient Prescriptions on File Prior to Visit  Medication Sig Dispense Refill  . ALPRAZolam (XANAX) 0.5 MG tablet 1-2 bid prn sleep and anxiety  45 tablet  2  . Ascorbic Acid (VITAMIN C) 1000 MG tablet Take 1,000 mg by mouth daily.        Marland Kitchen azelastine (ASTELIN) 137 MCG/SPRAY nasal spray TWO SPRAYS IN EACH NOSTRIL TWICE DAILY  30 mL  6  . cetirizine (ZYRTEC ALLERGY) 10 MG tablet Take 10 mg by mouth as needed.        . Cholecalciferol (VITAMIN D3) 1000 UNITS CAPS Take by mouth.        . Iodine, Kelp, (KELP PO) Take by mouth daily.      Marland Kitchen levalbuterol (XOPENEX HFA) 45 MCG/ACT inhaler Inhale 1-2 puffs into the lungs every 4 (four) hours as needed for wheezing. Every 4-6 hours as needed Exercise/sob/wheeze  1 Inhaler  11  . montelukast (SINGULAIR) 10 MG tablet TAKE ONE TABLET AT BEDTIME  30 tablet  6  . SYNTHROID  50 MCG tablet TAKE ONE TABLET EVERY DAY  30 tablet  6  . Vitamins-Lipotropics (B-100 COMPLEX) TABS Take by mouth daily.        Marland Kitchen DISCONTD: levothyroxine (SYNTHROID) 50 MCG tablet Take 1 tablet (50 mcg total) by mouth daily.  30 tablet  11    BP 110/70  Pulse 72  Temp 98.1 F (36.7 C)  Resp 16  Ht 5\' 4"  (1.626 m)  Wt 122 lb (55.339 kg)  BMI 20.94 kg/m2        Objective:   Physical Exam  Nursing note and vitals reviewed. Constitutional: Tasha Jackson is oriented to person, place, and time. Tasha Jackson appears well-developed and well-nourished. No distress.  HENT:  Head: Normocephalic and atraumatic.  Right Ear: External ear normal.  Left Ear: External ear normal.  Nose: Nose normal.  Mouth/Throat: Oropharynx is clear and moist.  Eyes: Conjunctivae and EOM are normal.  Pupils are equal, round, and reactive to light.  Neck: Normal range of motion. Neck supple. No JVD present. No tracheal deviation present. No thyromegaly present.  Cardiovascular: Normal rate, regular rhythm, normal heart sounds and intact distal pulses.   No murmur heard. Pulmonary/Chest: Effort normal and breath sounds normal. Tasha Jackson has no wheezes. Tasha Jackson exhibits no tenderness.  Abdominal: Soft. Bowel sounds are normal.  Musculoskeletal: Normal range of motion. Tasha Jackson exhibits no edema and no tenderness.  Lymphadenopathy:    Tasha Jackson has no cervical adenopathy.  Neurological: Tasha Jackson is alert and oriented to person, place, and time. Tasha Jackson has normal reflexes. No cranial nerve deficit.  Skin: Skin is warm and dry. Tasha Jackson is not diaphoretic.  Psychiatric: Tasha Jackson has a normal mood and affect. Her behavior is normal.          Assessment & Plan:  Work stress reduction Stable asthma Stable hypothyroidism     This is a routine physical examination for this healthy  Female. Reviewed all health maintenance protocols including mammography colonoscopy bone density and reviewed appropriate screening labs. Her immunization history was reviewed as well as her current medications and allergies refills of her chronic medications were given and the plan for yearly health maintenance was discussed all orders and referrals were made as appropriate.

## 2011-09-25 LAB — T3, FREE: T3, Free: 2.8 pg/mL (ref 2.3–4.2)

## 2011-10-01 ENCOUNTER — Ambulatory Visit
Admission: RE | Admit: 2011-10-01 | Discharge: 2011-10-01 | Disposition: A | Discharge status: Home / Self Care | Payer: BC Managed Care – PPO | Source: Ambulatory Visit | Attending: Obstetrics and Gynecology | Admitting: Obstetrics and Gynecology

## 2011-10-01 DIAGNOSIS — Z1231 Encounter for screening mammogram for malignant neoplasm of breast: Secondary | ICD-10-CM

## 2011-10-31 NOTE — Progress Notes (Signed)
  Subjective:    Patient ID: Tasha Jackson, female    DOB: [birth year], 56 y.o.   MRN: [id number]  HPI Patient is a 56 year old female who presents for followup of allergic rhinitis and history of asthma as well as a history of hypothyroidism in the setting of a thyroid nodule. She presents with a chief complaint of palpitations.  Of concern would be over replacement of thyroid medication in the setting of suppression of the thyroid nodule.  Also the patient is status post menstrual symptoms consistent with perimenopausal disorder and wishes a referral to a gynecologist     Review of Systems  Genitourinary: Positive for vaginal pain and menstrual problem.  Neurological: Positive for weakness.       Objective:   Physical Exam  Nursing note and vitals reviewed. Constitutional: She is oriented to person, place, and time. She appears well-developed and well-nourished. No distress.  HENT:  Head: Normocephalic and atraumatic.  Right Ear: External ear normal.  Left Ear: External ear normal.  Nose: Nose normal.  Mouth/Throat: Oropharynx is clear and moist.  Eyes: Conjunctivae normal and EOM are normal. Pupils are equal, round, and reactive to light.  Neck: Normal range of motion. Neck supple. No JVD present. No tracheal deviation present. No thyromegaly present.  Cardiovascular: Normal rate, regular rhythm, normal heart sounds and intact distal pulses.   No murmur heard. Pulmonary/Chest: Effort normal and breath sounds normal. She has no wheezes. She exhibits no tenderness.  Abdominal: Soft. Bowel sounds are normal.  Musculoskeletal: Normal range of motion. She exhibits no edema and no tenderness.  Lymphadenopathy:    She has no cervical adenopathy.  Neurological: She is alert and oriented to person, place, and time. She has normal reflexes. No cranial nerve deficit.  Skin: Skin is warm and dry. She is not diaphoretic.  Psychiatric: She has a normal mood and affect. Her behavior is  normal.          Assessment & Plan:  Referral made to GYN for evaluation of perimenopausal symptoms and discussion of hormone replacement.  Stable hypothyroidism Measure thyroid at August physical.  Stable asthma

## 2011-11-03 ENCOUNTER — Other Ambulatory Visit: Payer: Self-pay | Admitting: *Deleted

## 2011-11-03 ENCOUNTER — Telehealth: Payer: Self-pay | Admitting: *Deleted

## 2011-11-03 MED ORDER — ALPRAZOLAM ER 1 MG PO TB24: 1.0000 mg | ORAL_TABLET | ORAL | Status: DC

## 2011-11-03 NOTE — Telephone Encounter (Signed)
Per dr Lovell Sheehan- may have xanax xl 1 mg qd=pt informed

## 2011-11-03 NOTE — Telephone Encounter (Signed)
C/o insomnia- takes xanax .5 prn ,but is now calling requesting a long acting xanax thinking that will help her sleep- her gyn suggested prozac but she doesn't want to take that

## 2011-11-10 ENCOUNTER — Other Ambulatory Visit: Payer: Self-pay | Admitting: Internal Medicine

## 2011-11-12 ENCOUNTER — Other Ambulatory Visit: Payer: Self-pay | Admitting: *Deleted

## 2011-11-12 MED ORDER — ESTAZOLAM 2 MG PO TABS: 2.0000 mg | ORAL_TABLET | Freq: Every day | ORAL | Status: DC

## 2011-12-02 ENCOUNTER — Telehealth: Payer: Self-pay | Admitting: Internal Medicine

## 2011-12-02 NOTE — Telephone Encounter (Signed)
Pt called and needs documentation for her work, that pt had a wellness exam in August. Pls send copy of office note lab work from Aug dos. Pt also req lab results, pls call.

## 2011-12-02 NOTE — Telephone Encounter (Signed)
Faxed pt her activation code

## 2012-01-07 ENCOUNTER — Other Ambulatory Visit: Payer: Self-pay | Admitting: Internal Medicine

## 2012-01-14 ENCOUNTER — Ambulatory Visit: Payer: BC Managed Care – PPO | Admitting: Internal Medicine

## 2012-02-19 ENCOUNTER — Ambulatory Visit (INDEPENDENT_AMBULATORY_CARE_PROVIDER_SITE_OTHER): Payer: BC Managed Care – PPO | Admitting: Family Medicine

## 2012-02-19 ENCOUNTER — Encounter: Payer: Self-pay | Admitting: Family Medicine

## 2012-02-19 VITALS — BP 132/80 | HR 82 | Temp 98.6°F | Wt 121.0 lb

## 2012-02-19 DIAGNOSIS — J111 Influenza due to unidentified influenza virus with other respiratory manifestations: Secondary | ICD-10-CM

## 2012-02-19 DIAGNOSIS — H6690 Otitis media, unspecified, unspecified ear: Secondary | ICD-10-CM

## 2012-02-19 DIAGNOSIS — J45909 Unspecified asthma, uncomplicated: Secondary | ICD-10-CM

## 2012-02-19 MED ORDER — AZITHROMYCIN 250 MG PO TABS: ORAL_TABLET | ORAL | Status: DC

## 2012-02-19 NOTE — Patient Instructions (Signed)
Take antibiotic as instructed  Follow up if worsening or not improving with antibiotis

## 2012-02-19 NOTE — Progress Notes (Signed)
Chief Complaint  Patient presents with  . Fever    cough, sob, headache, fatigue     HPI:  Acute visit for URI - PMH anxiety, allergies: -started: 5 days -symptoms:nasal congestion, sore throat, cough - fever up to 102.9 at highest 5 days ago - now resolved for last few day. Malaise. Itchy eyes. Feels SOB when coughs. Ears feel full with ringing at times. Lymph node swollen under R ear. -denies: SOB - this is improving, NVD, tooth pain, sinus pain -has tried: alleve -sick contacts: husband with a cold  -Hx of: exercise induced asthma - wonders if can use albuterol -did not get flu vaccine   ROS: See pertinent positives and negatives per HPI.  Past Medical History  Diagnosis Date  . Hypothyroidism   . HSV (herpes simplex virus) infection   . IBS (irritable bowel syndrome)   . Lactose intolerance   . Anxiety   . IBS (irritable bowel syndrome)   . Allergy     TAKE 3 PILLS ALL YEAR ROUND  . Asthma     during exercise  . Osteopenia     Family History  Problem Relation Age of Onset  . Melanoma Mother   . Hypertension Father   . Heart disease Father   . Breast cancer Maternal Grandmother   . Colon cancer Neg Hx     History   Social History  . Marital Status: Married    Spouse Name: N/A    Number of Children: 2  . Years of Education: N/A   Occupational History  . Merchandising    Social History Main Topics  . Smoking status: Former Games developer  . Smokeless tobacco: Never Used  . Alcohol Use: Yes     Comment: 2 drinks a day   . Drug Use: No  . Sexually Active: Yes   Other Topics Concern  . None   Social History Narrative   2 caffeine drinks daily     Current outpatient prescriptions:ALPRAZolam (XANAX XR) 1 MG 24 hr tablet, Take 1 tablet (1 mg total) by mouth every morning., Disp: 30 tablet, Rfl: 3;  Ascorbic Acid (VITAMIN C) 1000 MG tablet, Take 1,000 mg by mouth daily.  , Disp: , Rfl: ;  azelastine (ASTELIN) 137 MCG/SPRAY nasal spray, TWO SPRAYS IN EACH  NOSTRIL TWICE DAILY, Disp: 30 mL, Rfl: 6;  celecoxib (CELEBREX) 200 MG capsule, , Disp: , Rfl:  cetirizine (ZYRTEC ALLERGY) 10 MG tablet, Take 10 mg by mouth as needed.  , Disp: , Rfl: ;  Cholecalciferol (VITAMIN D3) 1000 UNITS CAPS, Take by mouth.  , Disp: , Rfl: ;  estazolam (PROSOM) 2 MG tablet, TAKE ONE TABLET AT BEDTIME AS           NEEDED FOR SLEEP, Disp: 30 tablet, Rfl: 2;  Iodine, Kelp, (KELP PO), Take by mouth daily., Disp: , Rfl:  levalbuterol (XOPENEX HFA) 45 MCG/ACT inhaler, Inhale 1-2 puffs into the lungs every 4 (four) hours as needed for wheezing. Every 4-6 hours as needed Exercise/sob/wheeze, Disp: 1 Inhaler, Rfl: 11;  levothyroxine (SYNTHROID, LEVOTHROID) 50 MCG tablet, Take 50 mcg by mouth daily., Disp: , Rfl: ;  montelukast (SINGULAIR) 10 MG tablet, TAKE ONE TABLET AT BEDTIME, Disp: 30 tablet, Rfl: 6 SYNTHROID 50 MCG tablet, TAKE ONE TABLET EVERY DAY, Disp: 30 tablet, Rfl: 6;  Vitamins-Lipotropics (B-100 COMPLEX) TABS, Take by mouth daily.  , Disp: , Rfl: ;  azithromycin (ZITHROMAX) 250 MG tablet, 2 tablets on the first day then 1 tablet daily  for 4 more days, Disp: 6 tablet, Rfl: 0  EXAM:  Filed Vitals:   02/19/12 1427  BP: 132/80  Pulse: 82  Temp: 98.6 F (37 C)    There is no height on file to calculate BMI.  GENERAL: vitals reviewed and listed above, alert, oriented, appears well hydrated and in no acute distress  HEENT: atraumatic, conjunttiva clear, no obvious abnormalities on inspection of external nose and ears. normal appearance of ear canals - R TM erythematous with effusion and bulging, clear nasal congestion, mild post oropharyngeal erythema with PND, no tonsillar edema or exudate, no sinus TTP  NECK: no obvious masses on inspection, R ant cervical LAD  LUNGS: clear to auscultation bilaterally, no wheezes, rales or rhonchi, good air movement  CV: HRRR, no peripheral edema  MS: moves all extremities without noticeable abnormality  PSYCH: pleasant and  cooperative, no obvious depression or anxiety  ASSESSMENT AND PLAN:  Discussed the following assessment and plan:  1. Otitis media  azithromycin (ZITHROMAX) 250 MG tablet  2. Asthma    3. Influenza-like illness     -otitis media following influenza like illness -pulm exam normal today - advised to use her albuterol prn  -Patient advised to return or notify a doctor immediately if symptoms worsen or persist or new concerns arise.  Patient Instructions  Take antibiotic as instructed  Follow up if worsening or not improving with antibiotis     KIM, HANNAH R.

## 2012-02-24 ENCOUNTER — Ambulatory Visit (INDEPENDENT_AMBULATORY_CARE_PROVIDER_SITE_OTHER): Payer: BC Managed Care – PPO | Admitting: Family Medicine

## 2012-02-24 ENCOUNTER — Encounter: Payer: Self-pay | Admitting: Family Medicine

## 2012-02-24 ENCOUNTER — Telehealth: Payer: Self-pay | Admitting: Internal Medicine

## 2012-02-24 VITALS — BP 118/72 | HR 88 | Temp 97.9°F | Wt 118.0 lb

## 2012-02-24 DIAGNOSIS — J329 Chronic sinusitis, unspecified: Secondary | ICD-10-CM

## 2012-02-24 DIAGNOSIS — J069 Acute upper respiratory infection, unspecified: Secondary | ICD-10-CM

## 2012-02-24 MED ORDER — CEFDINIR 300 MG PO CAPS: 300.0000 mg | ORAL_CAPSULE | Freq: Every day | ORAL | Status: DC

## 2012-02-24 MED ORDER — CEFDINIR 300 MG PO CAPS: 600.0000 mg | ORAL_CAPSULE | Freq: Every day | ORAL | Status: DC

## 2012-02-24 NOTE — Telephone Encounter (Signed)
The abx should have taken care of sinus issues IF bacterial. If viral which is very likely -  abx will not do anything and symptoms can last for 1-2 weeks with cough lasting on average 18 days. If worsening or fevers should make an appointment.

## 2012-02-24 NOTE — Progress Notes (Signed)
Chief Complaint  Patient presents with  . Follow-up    recheck sinuses    HPI:  -started: 10 days ago, seen 1/16 with influenza like illness and ? secondary otitis media and treated with azithromycin -symptoms:fever initially resolved after a few days - continued nasal congestion, sinus congestion, sinus pain was better - but then got worse yesterday and today in the evening -denies:fever, NVD, tooth pain, strep or mono exposure -Hx of: sinusitis in the past and had to take zpack twice in a row in the past -takes a number of chronic allergy medication, not allergic to amoxicillin- but causes GI upset   ROS: See pertinent positives and negatives per HPI.  Past Medical History  Diagnosis Date  . Hypothyroidism   . HSV (herpes simplex virus) infection   . IBS (irritable bowel syndrome)   . Lactose intolerance   . Anxiety   . IBS (irritable bowel syndrome)   . Allergy     TAKE 3 PILLS ALL YEAR ROUND  . Asthma     during exercise  . Osteopenia     Family History  Problem Relation Age of Onset  . Melanoma Mother   . Hypertension Father   . Heart disease Father   . Breast cancer Maternal Grandmother   . Colon cancer Neg Hx     History   Social History  . Marital Status: Married    Spouse Name: N/A    Number of Children: 2  . Years of Education: N/A   Occupational History  . Merchandising    Social History Main Topics  . Smoking status: Former Games developer  . Smokeless tobacco: Never Used  . Alcohol Use: Yes     Comment: 2 drinks a day   . Drug Use: No  . Sexually Active: Yes   Other Topics Concern  . None   Social History Narrative   2 caffeine drinks daily     Current outpatient prescriptions:ALPRAZolam (XANAX XR) 1 MG 24 hr tablet, Take 1 tablet (1 mg total) by mouth every morning., Disp: 30 tablet, Rfl: 3;  Ascorbic Acid (VITAMIN C) 1000 MG tablet, Take 1,000 mg by mouth daily.  , Disp: , Rfl: ;  azelastine (ASTELIN) 137 MCG/SPRAY nasal spray, TWO SPRAYS IN  EACH NOSTRIL TWICE DAILY, Disp: 30 mL, Rfl: 6 azithromycin (ZITHROMAX) 250 MG tablet, 2 tablets on the first day then 1 tablet daily for 4 more days, Disp: 6 tablet, Rfl: 0;  celecoxib (CELEBREX) 200 MG capsule, , Disp: , Rfl: ;  cetirizine (ZYRTEC ALLERGY) 10 MG tablet, Take 10 mg by mouth as needed.  , Disp: , Rfl: ;  Cholecalciferol (VITAMIN D3) 1000 UNITS CAPS, Take by mouth.  , Disp: , Rfl:  estazolam (PROSOM) 2 MG tablet, TAKE ONE TABLET AT BEDTIME AS           NEEDED FOR SLEEP, Disp: 30 tablet, Rfl: 2;  Iodine, Kelp, (KELP PO), Take by mouth daily., Disp: , Rfl: ;  levalbuterol (XOPENEX HFA) 45 MCG/ACT inhaler, Inhale 1-2 puffs into the lungs every 4 (four) hours as needed for wheezing. Every 4-6 hours as needed Exercise/sob/wheeze, Disp: 1 Inhaler, Rfl: 11 levothyroxine (SYNTHROID, LEVOTHROID) 50 MCG tablet, Take 50 mcg by mouth daily., Disp: , Rfl: ;  montelukast (SINGULAIR) 10 MG tablet, TAKE ONE TABLET AT BEDTIME, Disp: 30 tablet, Rfl: 6;  SYNTHROID 50 MCG tablet, TAKE ONE TABLET EVERY DAY, Disp: 30 tablet, Rfl: 6;  Vitamins-Lipotropics (B-100 COMPLEX) TABS, Take by mouth daily.  ,  Disp: , Rfl:  cefdinir (OMNICEF) 300 MG capsule, Take 2 capsules (600 mg total) by mouth daily., Disp: 10 capsule, Rfl: 0  EXAM:  Filed Vitals:   02/24/12 1547  BP: 118/72  Pulse: 88  Temp: 97.9 F (36.6 C)    There is no height on file to calculate BMI.  GENERAL: vitals reviewed and listed above, alert, oriented, appears well hydrated and in no acute distress  HEENT: atraumatic, conjunttiva clear, no obvious abnormalities on inspection of external nose and ears, normal appearance of ear canals and TMs, clear nasal congestion, mild post oropharyngeal erythema with PND, no tonsillar edema or exudate, no sinus TTP  NECK: no obvious masses on inspection  LUNGS: clear to auscultation bilaterally, no wheezes, rales or rhonchi, good air movement  CV: HRRR, no peripheral edema  MS: moves all extremities  without noticeable abnormality  PSYCH: pleasant and cooperative, no obvious depression or anxiety  ASSESSMENT AND PLAN:  Discussed the following assessment and plan:  1. Upper respiratory infection   2. Sinusitis    -ears look great today, likely viral - potential for bacterial sinusitis small, advised supportive care, but if not improving or worsens abx, cefdinir, to start (discussed risks) -Patient advised to return or notify a doctor immediately if symptoms worsen or persist or new concerns arise.  Patient Instructions  INSTRUCTIONS FOR UPPER RESPIRATORY INFECTION:  -plenty of rest and fluids  -nasal saline wash 2-3 times daily (use prepackaged nasal saline or bottled/distilled water if making your own)   -clean nose with nasal saline before using the nasal steroid or sinex  -can use sinex nasal spray for drainage and nasal congestion - but do NOT use longer then 3-4 days  -can use tylenol or ibuprofen as directed for aches and sorethroat  -in the winter time, using a humidifier at night is helpful (please follow cleaning instructions)  -if you are taking a cough medication - use only as directed, may also try a teaspoon of honey to coat the throat and throat lozenges  -for sore throat, salt water gargles can help  -follow up if you have fevers, facial pain, tooth pain, difficulty breathing or are worsening or not getting better in 5-7 days      KIM, HANNAH R.

## 2012-02-24 NOTE — Telephone Encounter (Signed)
pls advise

## 2012-02-24 NOTE — Patient Instructions (Addendum)

## 2012-02-24 NOTE — Telephone Encounter (Signed)
Tasha Jackson, pt is aware of Dr. Elmyra Ricks instructions. She states in the past, with her sinus stuff, she's had to do 2 courses of Zpacks. Wondering if that is a good option? Please advise and call pt. Thank you. (I made pt appt for 3:45 at her request, call to cancel if not needed).

## 2012-02-24 NOTE — Telephone Encounter (Signed)
Advised keeping appt so we can look at her sinuses and see if further abx needed.

## 2012-02-24 NOTE — Telephone Encounter (Signed)
Was in to see Dr. Selena Batten last Thurs and given Zpack .She is not better - current symptoms are: stuffy ehad, painful forehead, dry cough, sore throat, no fever. Please advise and call. Thinks she has sinus infection?

## 2012-02-24 NOTE — Telephone Encounter (Signed)
Pls advise.  

## 2012-03-20 ENCOUNTER — Other Ambulatory Visit: Payer: Self-pay

## 2012-03-23 ENCOUNTER — Other Ambulatory Visit: Payer: Self-pay | Admitting: *Deleted

## 2012-03-23 MED ORDER — ESTAZOLAM 2 MG PO TABS: 2.0000 mg | ORAL_TABLET | Freq: Every evening | ORAL | Status: DC | PRN

## 2012-04-16 ENCOUNTER — Other Ambulatory Visit: Payer: Self-pay | Admitting: Internal Medicine

## 2012-06-15 ENCOUNTER — Other Ambulatory Visit: Payer: Self-pay | Admitting: Obstetrics and Gynecology

## 2012-06-16 ENCOUNTER — Other Ambulatory Visit: Payer: Self-pay | Admitting: Internal Medicine

## 2012-06-17 ENCOUNTER — Other Ambulatory Visit: Payer: Self-pay | Admitting: Internal Medicine

## 2012-07-09 ENCOUNTER — Other Ambulatory Visit: Payer: Self-pay | Admitting: *Deleted

## 2012-07-09 MED ORDER — ALPRAZOLAM ER 1 MG PO TB24: 1.0000 mg | ORAL_TABLET | ORAL | Status: DC

## 2012-07-30 ENCOUNTER — Telehealth: Payer: Self-pay | Admitting: *Deleted

## 2012-07-30 MED ORDER — METHYLPREDNISOLONE 4 MG PO KIT: PACK | ORAL | Status: DC

## 2012-07-30 NOTE — Telephone Encounter (Signed)
Per dr Lovell Sheehan- ok to send medrol dose pack

## 2012-07-30 NOTE — Telephone Encounter (Signed)
C/o poison ivy all over and itching- requesting pred dose pack--[facility]

## 2012-08-31 ENCOUNTER — Other Ambulatory Visit: Payer: Self-pay

## 2012-08-31 DIAGNOSIS — Z1231 Encounter for screening mammogram for malignant neoplasm of breast: Secondary | ICD-10-CM

## 2012-09-01 ENCOUNTER — Other Ambulatory Visit: Payer: Self-pay | Admitting: *Deleted

## 2012-09-01 MED ORDER — AZELASTINE HCL 0.1 % NA SOLN: NASAL | Status: DC

## 2012-10-06 ENCOUNTER — Ambulatory Visit: Payer: BC Managed Care – PPO

## 2012-10-18 ENCOUNTER — Other Ambulatory Visit: Payer: Self-pay | Admitting: Internal Medicine

## 2012-10-20 ENCOUNTER — Ambulatory Visit
Admission: RE | Admit: 2012-10-20 | Discharge: 2012-10-20 | Disposition: A | Discharge status: Home / Self Care | Payer: No Typology Code available for payment source | Source: Ambulatory Visit

## 2012-10-20 DIAGNOSIS — Z1231 Encounter for screening mammogram for malignant neoplasm of breast: Secondary | ICD-10-CM

## 2012-11-24 ENCOUNTER — Other Ambulatory Visit: Payer: Self-pay | Admitting: Internal Medicine

## 2012-12-09 ENCOUNTER — Other Ambulatory Visit: Payer: Self-pay

## 2013-01-20 ENCOUNTER — Other Ambulatory Visit: Payer: Self-pay | Admitting: Internal Medicine

## 2013-04-28 ENCOUNTER — Other Ambulatory Visit: Payer: Self-pay | Admitting: Internal Medicine

## 2013-06-23 ENCOUNTER — Encounter: Payer: Self-pay | Admitting: Physician Assistant

## 2013-06-23 ENCOUNTER — Ambulatory Visit (INDEPENDENT_AMBULATORY_CARE_PROVIDER_SITE_OTHER): Payer: BC Managed Care – PPO | Admitting: Physician Assistant

## 2013-06-23 VITALS — BP 102/80 | HR 84 | Temp 97.8°F | Resp 18 | Wt 124.0 lb

## 2013-06-23 DIAGNOSIS — R21 Rash and other nonspecific skin eruption: Secondary | ICD-10-CM

## 2013-06-23 MED ORDER — METHYLPREDNISOLONE (PAK) 4 MG PO TABS: ORAL_TABLET | ORAL | Status: DC

## 2013-06-23 NOTE — Patient Instructions (Signed)
You will be called to schedule an appointment with the allergist.  Keep appointment with your dermatologist.  Medrol Dosepak, follow pack instructions, 21 day course.  Plain Eucerin lotion for dry skin.  Followup in about 2 weeks to reassess. Followup sooner if symptoms worsen or fail to improve despite treatment.  Rash A rash is a change in the color or feel of your skin. There are many different types of rashes. You may have other problems along with your rash. HOME CARE  Avoid the thing that caused your rash.  Do not scratch your rash.  You may take cools baths to help stop itching.  Only take medicines as told by your doctor.  Keep all doctor visits as told. GET HELP RIGHT AWAY IF:   Your pain, puffiness (swelling), or redness gets worse.  You have a fever.  You have new or severe problems.  You have body aches, watery poop (diarrhea), or you throw up (vomit).  Your rash is not better after 3 days. MAKE SURE YOU:   Understand these instructions.  Will watch your condition.  Will get help right away if you are not doing well or get worse. Document Released: 07/09/2007 Document Revised: 04/14/2011 Document Reviewed: 11/04/2010 [facility] Patient Information 2014 [employer], [place].

## 2013-06-23 NOTE — Progress Notes (Signed)
Subjective:    Patient ID: Tasha Jackson, female    DOB: 02-Jul-1955, 58 y.o.   MRN: [id number]  Rash This is a chronic problem. The current episode started more than 1 year ago. The problem has been rapidly worsening (over the last month, after returning home from a vacation in [place].) since onset. The affected locations include the scalp, face, neck, chest, abdomen, left shoulder, left axilla, left arm, left elbow, left hand, left lower leg, left ear, right shoulder, right arm, right hand, right fingers, right upper leg, right lower leg and back. The rash is characterized by itchiness and redness (bumps isolated to chest area. Scaling isolated to left palm.). She was exposed to nothing. Pertinent negatives include no anorexia, congestion, cough, diarrhea, eye pain, facial edema, fatigue, fever, joint pain, nail changes, rhinorrhea, shortness of breath, sore throat or vomiting. Past treatments include anti-itch cream, antibiotic cream, topical steroids, moisturizer and antihistamine. The treatment provided mild relief. Her past medical history is significant for allergies and asthma. There is no history of eczema.      Review of Systems  Constitutional: Negative for fever, chills and fatigue.  HENT: Negative for congestion, rhinorrhea and sore throat.   Eyes: Negative for pain.  Respiratory: Negative for cough and shortness of breath.   Gastrointestinal: Negative for nausea, vomiting, diarrhea and anorexia.  Musculoskeletal: Negative for joint pain.  Skin: Positive for rash. Negative for nail changes.  All other systems reviewed and are negative.  Past Medical History  Diagnosis Date  . Hypothyroidism   . HSV (herpes simplex virus) infection   . IBS (irritable bowel syndrome)   . Lactose intolerance   . Anxiety   . IBS (irritable bowel syndrome)   . Allergy     TAKE 3 PILLS ALL YEAR ROUND  . Asthma     during exercise  . Osteopenia    Past Surgical History  Procedure Laterality  Date  . Elbow surgery      Tennis elbow     reports that she has quit smoking. She has never used smokeless tobacco. She reports that she drinks alcohol. She reports that she does not use illicit drugs. family history includes Breast cancer in her maternal grandmother; Heart disease in her father; Hypertension in her father; Melanoma in her mother. There is no history of Colon cancer. Allergies  Allergen Reactions  . Amoxicillin-Pot Clavulanate     REACTION: GI upset       Objective:   Physical Exam  Nursing note and vitals reviewed. Constitutional: She is oriented to person, place, and time. She appears well-developed and well-nourished. No distress.  HENT:  Head: Normocephalic and atraumatic.  Eyes: Conjunctivae and EOM are normal. Pupils are equal, round, and reactive to light.  Neck: Normal range of motion. Neck supple.  Cardiovascular: Normal rate, regular rhythm, normal heart sounds and intact distal pulses.  Exam reveals no gallop and no friction rub.   No murmur heard. Pulmonary/Chest: Effort normal and breath sounds normal. No respiratory distress. She has no wheezes. She has no rales. She exhibits no tenderness.  Musculoskeletal: Normal range of motion.  Neurological: She is alert and oriented to person, place, and time.  Skin: Skin is warm and dry. Rash noted. She is not diaphoretic. There is erythema (some erythema limited to some of the lesions.). No pallor.  Diffuse macular rash over bilateral arms trunk back bilateral lower extremities. Lesions range in size from subcentimeter to 3 cm in diameter. No tenderness to  palpation.  Right shoulder/chest area has small subcentimeter area of erythema with a few small papules. They are nontender to palpation no fluctuance  Left hand has a 4 x 3 cm circular area of his skin flaking, which is slightly erythematous. No tenderness to palpation. No fluctuance.  Left scalp has a 4 cm diameter circular erythematous lesion with some  skin flaking. No tenderness to palpation no fluctuance.  Left neck and left posterior ear has a diffuse area of erythema with some skin flaking. No tenderness to palpation and no fluctuance.    Psychiatric: She has a normal mood and affect. Her behavior is normal. Judgment and thought content normal.   Filed Vitals:   06/23/13 0834  BP: 102/80  Pulse: 84  Temp: 97.8 F (36.6 C)  Resp: 18    Lab Results  Component Value Date   WBC 4.8 09/24/2011   HGB 13.6 09/24/2011   HCT 40.8 09/24/2011   PLT 338.0 09/24/2011   GLUCOSE 82 09/24/2011   CHOL 216* 09/24/2011   TRIG 62.0 09/24/2011   HDL 112.20 09/24/2011   LDLDIRECT 96.9 09/24/2011   LDLCALC 89 07/07/2007   ALT 18 12/04/2009   AST 23 12/04/2009   NA 138 09/24/2011   K 4.4 09/24/2011   CL 102 09/24/2011   CREATININE 0.6 09/24/2011   BUN 13 09/24/2011   CO2 30 09/24/2011   TSH 1.41 09/24/2011      Assessment & Plan:  Tasha Jackson was seen today for rash.  Diagnoses and associated orders for this visit:  Rash and nonspecific skin eruption - methylPREDNIsolone (MEDROL DOSPACK) 4 MG tablet; follow package directions - Ambulatory referral to Allergy - Patient has extensive history of allergies in family including self, her mother, and her daughter. She was seen by an allergist for allergy testing over 30 years ago, and states that she had very many allergies. She is not regularly treated for allergies with any antihistamines. Referral for retesting of allergies for better symptom control.  Patient will keep appointment with dermatologist.  Patient will followup in about 2 weeks to reassess.  Patient Instructions  You will be called to schedule an appointment with the allergist.  Keep appointment with your dermatologist.  Medrol Dosepak, follow pack instructions, 21 day course.  Plain Eucerin lotion for dry skin.  Followup in about 2 weeks to reassess. Followup sooner if symptoms worsen or fail to improve despite treatment.

## 2013-06-23 NOTE — Progress Notes (Signed)
Pre visit review using our clinic review tool, if applicable. No additional management support is needed unless otherwise documented below in the visit note. 

## 2013-07-01 ENCOUNTER — Encounter: Payer: Self-pay | Admitting: Physician Assistant

## 2013-07-01 ENCOUNTER — Ambulatory Visit (INDEPENDENT_AMBULATORY_CARE_PROVIDER_SITE_OTHER): Payer: BC Managed Care – PPO | Admitting: Physician Assistant

## 2013-07-01 VITALS — BP 120/80 | HR 87 | Temp 98.0°F | Resp 18 | Ht 64.0 in | Wt 123.0 lb

## 2013-07-01 DIAGNOSIS — R21 Rash and other nonspecific skin eruption: Secondary | ICD-10-CM

## 2013-07-01 DIAGNOSIS — Z09 Encounter for follow-up examination after completed treatment for conditions other than malignant neoplasm: Secondary | ICD-10-CM

## 2013-07-01 NOTE — Progress Notes (Signed)
Subjective:    Patient ID: Tasha Jackson, female    DOB: 12-27-1955, 58 y.o.   MRN: [id number]  HPI Patient is a 58 year old Caucasian female presenting to the clinic for a followup of a rash. She was seen last week for a rash that had progressively involved her entire body over the course of about a year. She was prescribed a prednisone taper for this, and had a referral to allergy for possible allergic nature. At that visit she had already scheduled an appointment to see a dermatologist in a few weeks. She states that the prednisone really helped her symptoms, the rashes decreased in size and severity, and her itching completely stopped after a few days of taking the prednisone. She states that since the prednisone taper has been completed, she has noticed some returning itching symptoms. She states that she thinks she will wait to try anything else until she sees the allergist and dermatologist. She denies fevers, chills, nausea, vomiting, diarrhea, and shortness of breath.   Review of Systems As per the history of present illness and are otherwise negative.   Past Medical History  Diagnosis Date  . Hypothyroidism   . HSV (herpes simplex virus) infection   . IBS (irritable bowel syndrome)   . Lactose intolerance   . Anxiety   . IBS (irritable bowel syndrome)   . Allergy     TAKE 3 PILLS ALL YEAR ROUND  . Asthma     during exercise  . Osteopenia    Past Surgical History  Procedure Laterality Date  . Elbow surgery      Tennis elbow     reports that she has quit smoking. She has never used smokeless tobacco. She reports that she drinks alcohol. She reports that she does not use illicit drugs. family history includes Breast cancer in her maternal grandmother; Heart disease in her father; Hypertension in her father; Melanoma in her mother. There is no history of Colon cancer. Allergies  Allergen Reactions  . Amoxicillin-Pot Clavulanate     REACTION: GI upset       Objective:     Physical Exam  Nursing note and vitals reviewed. Constitutional: She is oriented to person, place, and time. She appears well-developed and well-nourished. No distress.  HENT:  Head: Normocephalic and atraumatic.  Eyes: Conjunctivae and EOM are normal. Pupils are equal, round, and reactive to light.  Neck: Normal range of motion. Neck supple.  Cardiovascular: Normal rate, regular rhythm, normal heart sounds and intact distal pulses.  Exam reveals no gallop and no friction rub.   No murmur heard. Pulmonary/Chest: Effort normal and breath sounds normal. No respiratory distress. She has no wheezes. She has no rales. She exhibits no tenderness.  Musculoskeletal: Normal range of motion.  Neurological: She is alert and oriented to person, place, and time.  Skin: Skin is warm and dry. Rash noted. She is not diaphoretic. There is erythema. No pallor.  Diffuse macular rash over bilateral arms trunk back bilateral lower extremities. Lesions range in size from subcentimeter to 3 cm in diameter. No tenderness to palpation. These are less noticeable this week, less erythematous.  Right shoulder/chest area has small subcentimeter area of erythema with a few small papules. They are nontender to palpation no fluctuance. These are less erythematous than last week.   Left hand has a 4 x 3 cm circular area of his skin flaking, which is slightly erythematous. No tenderness to palpation. No fluctuance. This is still present from last week,  but looks improved.  Left Scalp rash seems to have resolved completely since last visit.  Left neck and left posterior ear still have some mild erythema however much improved since last visit.  Psychiatric: She has a normal mood and affect. Her behavior is normal. Judgment and thought content normal.   Filed Vitals:   07/01/13 1101  BP: 120/80  Pulse: 87  Temp: 98 F (36.7 C)  Resp: 18    Lab Results  Component Value Date   WBC 4.8 09/24/2011   HGB 13.6 09/24/2011    HCT 40.8 09/24/2011   PLT 338.0 09/24/2011   GLUCOSE 82 09/24/2011   CHOL 216* 09/24/2011   TRIG 62.0 09/24/2011   HDL 112.20 09/24/2011   LDLDIRECT 96.9 09/24/2011   LDLCALC 89 07/07/2007   ALT 18 12/04/2009   AST 23 12/04/2009   NA 138 09/24/2011   K 4.4 09/24/2011   CL 102 09/24/2011   CREATININE 0.6 09/24/2011   BUN 13 09/24/2011   CO2 30 09/24/2011   TSH 1.41 09/24/2011       Assessment & Plan:  Tasha Jackson was seen today for follow-up.  Diagnoses and associated orders for this visit:  Rash and nonspecific skin eruption Comments: Prednisone relieved symptoms. Lesions look better than 1 week ago. Now symptoms slowly returning.  Follow up Comments: Prednisone helped, has appointments scheduled with Allergy and Dermatology over the next 2 weeks.   Prednisone appears to have helped, although now symptoms are returning. Will see what the allergist and the dermatologist opinions are on how to better/more permanently treat this.  Plan to follow up in about a month to reassess.  Patient Instructions  Keep your appointments with dermatology and allergy.  They may have a better idea for a more permanent solution to your symptoms.  Followup in about a month to reassess. Or sooner as needed.

## 2013-07-01 NOTE — Patient Instructions (Addendum)
Keep your appointments with dermatology and allergy.  They may have a better idea for a more permanent solution to your symptoms.  Followup in about a month to reassess. Or sooner as needed.    Rash A rash is a change in the color or feel of your skin. There are many different types of rashes. You may have other problems along with your rash. HOME CARE  Avoid the thing that caused your rash.  Do not scratch your rash.  You may take cools baths to help stop itching.  Only take medicines as told by your doctor.  Keep all doctor visits as told. GET HELP RIGHT AWAY IF:   Your pain, puffiness (swelling), or redness gets worse.  You have a fever.  You have new or severe problems.  You have body aches, watery poop (diarrhea), or you throw up (vomit).  Your rash is not better after 3 days. MAKE SURE YOU:   Understand these instructions.  Will watch your condition.  Will get help right away if you are not doing well or get worse. Document Released: 07/09/2007 Document Revised: 04/14/2011 Document Reviewed: 11/04/2010 [facility] Patient Information 2014 [place], [place].

## 2013-07-01 NOTE — Progress Notes (Signed)
Pre visit review using our clinic review tool, if applicable. No additional management support is needed unless otherwise documented below in the visit note. 

## 2013-07-05 ENCOUNTER — Other Ambulatory Visit: Payer: Self-pay | Admitting: Internal Medicine

## 2013-08-01 ENCOUNTER — Telehealth: Payer: Self-pay | Admitting: Internal Medicine

## 2013-08-01 MED ORDER — LEVALBUTEROL TARTRATE 45 MCG/ACT IN AERO: 1.0000 | INHALATION_SPRAY | RESPIRATORY_TRACT | Status: DC | PRN

## 2013-08-01 NOTE — Telephone Encounter (Signed)
[facility], [place] - [address] is requesting re-fill on levalbuterol (XOPENEX HFA) 45 MCG/ACT inhaler

## 2013-08-01 NOTE — Telephone Encounter (Signed)
Rx sent to pharmacy   

## 2013-09-06 ENCOUNTER — Other Ambulatory Visit: Payer: Self-pay | Admitting: Internal Medicine

## 2013-09-06 DIAGNOSIS — E041 Nontoxic single thyroid nodule: Secondary | ICD-10-CM

## 2013-09-12 ENCOUNTER — Ambulatory Visit
Admission: RE | Admit: 2013-09-12 | Discharge: 2013-09-12 | Disposition: A | Discharge status: Home / Self Care | Payer: No Typology Code available for payment source | Source: Ambulatory Visit | Attending: Internal Medicine | Admitting: Internal Medicine

## 2013-09-12 DIAGNOSIS — E041 Nontoxic single thyroid nodule: Secondary | ICD-10-CM

## 2013-09-29 ENCOUNTER — Other Ambulatory Visit: Payer: Self-pay

## 2013-09-29 DIAGNOSIS — Z1231 Encounter for screening mammogram for malignant neoplasm of breast: Secondary | ICD-10-CM

## 2013-10-25 ENCOUNTER — Ambulatory Visit
Admission: RE | Admit: 2013-10-25 | Discharge: 2013-10-25 | Disposition: A | Discharge status: Home / Self Care | Payer: No Typology Code available for payment source | Source: Ambulatory Visit

## 2013-10-25 DIAGNOSIS — Z1231 Encounter for screening mammogram for malignant neoplasm of breast: Secondary | ICD-10-CM

## 2014-05-31 ENCOUNTER — Other Ambulatory Visit: Payer: Self-pay | Admitting: Internal Medicine

## 2014-05-31 DIAGNOSIS — N644 Mastodynia: Secondary | ICD-10-CM

## 2014-05-31 DIAGNOSIS — N6310 Unspecified lump in the right breast, unspecified quadrant: Secondary | ICD-10-CM

## 2014-06-05 ENCOUNTER — Other Ambulatory Visit: Payer: Self-pay | Admitting: Internal Medicine

## 2014-06-05 ENCOUNTER — Ambulatory Visit
Admission: RE | Admit: 2014-06-05 | Discharge: 2014-06-05 | Disposition: A | Discharge status: Home / Self Care | Payer: Managed Care, Other (non HMO) | Source: Ambulatory Visit | Attending: Internal Medicine | Admitting: Internal Medicine

## 2014-06-05 ENCOUNTER — Ambulatory Visit
Admission: RE | Admit: 2014-06-05 | Discharge: 2014-06-05 | Disposition: A | Discharge status: Home / Self Care | Payer: No Typology Code available for payment source | Source: Ambulatory Visit | Attending: Internal Medicine | Admitting: Internal Medicine

## 2014-06-05 DIAGNOSIS — N6310 Unspecified lump in the right breast, unspecified quadrant: Secondary | ICD-10-CM

## 2014-06-05 DIAGNOSIS — N644 Mastodynia: Secondary | ICD-10-CM

## 2014-10-12 ENCOUNTER — Other Ambulatory Visit: Payer: Self-pay

## 2014-10-12 DIAGNOSIS — Z1231 Encounter for screening mammogram for malignant neoplasm of breast: Secondary | ICD-10-CM

## 2014-10-30 ENCOUNTER — Ambulatory Visit: Payer: Managed Care, Other (non HMO)

## 2014-12-08 ENCOUNTER — Ambulatory Visit
Admission: RE | Admit: 2014-12-08 | Discharge: 2014-12-08 | Disposition: A | Discharge status: Home / Self Care | Payer: Managed Care, Other (non HMO) | Source: Ambulatory Visit

## 2014-12-08 DIAGNOSIS — Z1231 Encounter for screening mammogram for malignant neoplasm of breast: Secondary | ICD-10-CM

## 2015-11-20 ENCOUNTER — Other Ambulatory Visit: Payer: Self-pay | Admitting: Obstetrics and Gynecology

## 2015-11-20 DIAGNOSIS — Z1231 Encounter for screening mammogram for malignant neoplasm of breast: Secondary | ICD-10-CM

## 2015-12-04 DIAGNOSIS — M859 Disorder of bone density and structure, unspecified: Secondary | ICD-10-CM | POA: Diagnosis not present

## 2015-12-04 DIAGNOSIS — E041 Nontoxic single thyroid nodule: Secondary | ICD-10-CM | POA: Diagnosis not present

## 2015-12-04 DIAGNOSIS — Z0000 Encounter for general adult medical examination without abnormal findings: Secondary | ICD-10-CM | POA: Diagnosis not present

## 2015-12-05 DIAGNOSIS — L308 Other specified dermatitis: Secondary | ICD-10-CM | POA: Diagnosis not present

## 2015-12-05 DIAGNOSIS — Z79899 Other long term (current) drug therapy: Secondary | ICD-10-CM | POA: Diagnosis not present

## 2015-12-05 DIAGNOSIS — L259 Unspecified contact dermatitis, unspecified cause: Secondary | ICD-10-CM | POA: Diagnosis not present

## 2015-12-10 DIAGNOSIS — Z0000 Encounter for general adult medical examination without abnormal findings: Secondary | ICD-10-CM | POA: Diagnosis not present

## 2015-12-10 DIAGNOSIS — L309 Dermatitis, unspecified: Secondary | ICD-10-CM | POA: Diagnosis not present

## 2015-12-10 DIAGNOSIS — J019 Acute sinusitis, unspecified: Secondary | ICD-10-CM | POA: Diagnosis not present

## 2015-12-10 DIAGNOSIS — Z1389 Encounter for screening for other disorder: Secondary | ICD-10-CM | POA: Diagnosis not present

## 2015-12-10 DIAGNOSIS — N951 Menopausal and female climacteric states: Secondary | ICD-10-CM | POA: Diagnosis not present

## 2015-12-10 DIAGNOSIS — E784 Other hyperlipidemia: Secondary | ICD-10-CM | POA: Diagnosis not present

## 2015-12-13 DIAGNOSIS — R21 Rash and other nonspecific skin eruption: Secondary | ICD-10-CM | POA: Diagnosis not present

## 2015-12-19 ENCOUNTER — Ambulatory Visit
Admission: RE | Admit: 2015-12-19 | Discharge: 2015-12-19 | Disposition: A | Discharge status: Home / Self Care | Payer: BLUE CROSS/BLUE SHIELD | Source: Ambulatory Visit | Attending: Obstetrics and Gynecology | Admitting: Obstetrics and Gynecology

## 2015-12-19 DIAGNOSIS — Z1231 Encounter for screening mammogram for malignant neoplasm of breast: Secondary | ICD-10-CM | POA: Diagnosis not present

## 2016-01-16 DIAGNOSIS — L309 Dermatitis, unspecified: Secondary | ICD-10-CM | POA: Diagnosis not present

## 2016-01-16 DIAGNOSIS — Z79899 Other long term (current) drug therapy: Secondary | ICD-10-CM | POA: Diagnosis not present

## 2016-02-22 DIAGNOSIS — Z79899 Other long term (current) drug therapy: Secondary | ICD-10-CM | POA: Diagnosis not present

## 2016-03-10 DIAGNOSIS — M542 Cervicalgia: Secondary | ICD-10-CM | POA: Diagnosis not present

## 2016-03-10 DIAGNOSIS — M545 Low back pain: Secondary | ICD-10-CM | POA: Diagnosis not present

## 2016-03-13 DIAGNOSIS — M545 Low back pain: Secondary | ICD-10-CM | POA: Diagnosis not present

## 2016-03-13 DIAGNOSIS — M542 Cervicalgia: Secondary | ICD-10-CM | POA: Diagnosis not present

## 2016-03-17 DIAGNOSIS — M542 Cervicalgia: Secondary | ICD-10-CM | POA: Diagnosis not present

## 2016-03-17 DIAGNOSIS — M545 Low back pain: Secondary | ICD-10-CM | POA: Diagnosis not present

## 2016-03-19 DIAGNOSIS — M545 Low back pain: Secondary | ICD-10-CM | POA: Diagnosis not present

## 2016-03-19 DIAGNOSIS — M542 Cervicalgia: Secondary | ICD-10-CM | POA: Diagnosis not present

## 2016-03-21 DIAGNOSIS — Z79899 Other long term (current) drug therapy: Secondary | ICD-10-CM | POA: Diagnosis not present

## 2016-03-24 DIAGNOSIS — M542 Cervicalgia: Secondary | ICD-10-CM | POA: Diagnosis not present

## 2016-03-24 DIAGNOSIS — M545 Low back pain: Secondary | ICD-10-CM | POA: Diagnosis not present

## 2016-03-26 DIAGNOSIS — M542 Cervicalgia: Secondary | ICD-10-CM | POA: Diagnosis not present

## 2016-03-26 DIAGNOSIS — M545 Low back pain: Secondary | ICD-10-CM | POA: Diagnosis not present

## 2016-04-03 DIAGNOSIS — M542 Cervicalgia: Secondary | ICD-10-CM | POA: Diagnosis not present

## 2016-04-03 DIAGNOSIS — M545 Low back pain: Secondary | ICD-10-CM | POA: Diagnosis not present

## 2016-04-09 DIAGNOSIS — M542 Cervicalgia: Secondary | ICD-10-CM | POA: Diagnosis not present

## 2016-04-09 DIAGNOSIS — M545 Low back pain: Secondary | ICD-10-CM | POA: Diagnosis not present

## 2016-05-15 DIAGNOSIS — L308 Other specified dermatitis: Secondary | ICD-10-CM | POA: Diagnosis not present

## 2016-07-10 ENCOUNTER — Ambulatory Visit: Payer: BLUE CROSS/BLUE SHIELD | Admitting: Sports Medicine

## 2016-07-14 DIAGNOSIS — Z79899 Other long term (current) drug therapy: Secondary | ICD-10-CM | POA: Diagnosis not present

## 2016-07-17 ENCOUNTER — Ambulatory Visit (INDEPENDENT_AMBULATORY_CARE_PROVIDER_SITE_OTHER): Payer: BLUE CROSS/BLUE SHIELD | Admitting: Sports Medicine

## 2016-07-17 ENCOUNTER — Encounter: Payer: Self-pay | Admitting: Sports Medicine

## 2016-07-17 VITALS — BP 124/80 | Ht 65.0 in | Wt 120.0 lb

## 2016-07-17 DIAGNOSIS — M8430XA Stress fracture, unspecified site, initial encounter for fracture: Secondary | ICD-10-CM

## 2016-07-17 NOTE — Progress Notes (Signed)
   Subjective:    Patient ID: Tasha Jackson, female    DOB: 1955-09-27, 61 y.o.   MRN: [id number]  HPI Tasha Jackson is a 61yo female presenting today for left foot pain. Notes bilateral foot pain, worse over left foot. Initially complained of diffuse pain of bilateral feet, including ball of foot, ankles, Achilles, heels, and bunionette on left foot. Further questioning revealed that primary reason for visit today is a new acute pain in her left forefoot present for the last two weeks following a tennis tournament. Initially limped, however the pain has gradually improved and she no longer limps. After initial injury, reports she had difficulty putting weight on the front of her left foot and had difficulty pushing off. Pain in foot is worse when going up or down stairs, especially if barefooted. Only wears tennis shoes when she plays tennis, otherwise wears sandals. Following injury and change in gait, has noted bilateral knee pain. Has tried insoles from [employer], heel cups, Kinesiology tape, Aspirin. NSAIDs irritated stomach.   Review of Systems Per HPI    Objective:   Physical Exam  Constitutional: She appears well-developed and well-nourished. No distress.  Cardiovascular: Normal rate.   No murmur heard. Pulmonary/Chest: Effort normal. No respiratory distress.  Musculoskeletal:  Mild edema over dorsal surface of left foot. Ankle ROM symmetric. No tenderness over bilateral medial or lateral malleoli. Minimal tenderness over bilateral Achilles insertion sites and bilateral plantar fascia. Minimal tenderness over metatarsal heads of right foot, significant tenderness over 2nd and 3rd metatarsal head of left foot worse with compression. Anterior drawer of ankles symmetric bilaterally.   Psychiatric: She has a normal mood and affect. Her behavior is normal.   [name]: Limited US of left foot obtained. Pocket of fluid noted at head of 3rd metatarsal outside of joint capsule with increased vascularity  in surrounding area and bone; no cortical interruptions noted.     Assessment & Plan:  1. Stress reaction of bone: 3rd Metatarsal Suspected obtained during tennis tournament. Improving. Recommend continuing to abstain from tennis, but may continue to bike. Recommend wearing tennis shoes for support and avoiding sandals. May use Aspercreme for pain.  Follow up in 3 weeks. Consider discussing other areas of pain at future visits (Achilles, Plantar Fascia, Knees)

## 2016-08-11 ENCOUNTER — Ambulatory Visit (INDEPENDENT_AMBULATORY_CARE_PROVIDER_SITE_OTHER): Payer: BLUE CROSS/BLUE SHIELD | Admitting: Sports Medicine

## 2016-08-11 ENCOUNTER — Encounter: Payer: Self-pay | Admitting: Sports Medicine

## 2016-08-11 VITALS — BP 130/76 | Ht 65.0 in | Wt 120.0 lb

## 2016-08-11 DIAGNOSIS — M25562 Pain in left knee: Secondary | ICD-10-CM

## 2016-08-11 DIAGNOSIS — M25561 Pain in right knee: Secondary | ICD-10-CM

## 2016-08-11 DIAGNOSIS — M8430XA Stress fracture, unspecified site, initial encounter for fracture: Secondary | ICD-10-CM

## 2016-08-11 NOTE — Progress Notes (Signed)
   Subjective:    Patient ID: Tasha Jackson, female    DOB: 28-May-1955, 61 y.o.   MRN: [id number]  HPI chief complaint: left foot pain follow up   Patient here for follow up on left foot pain thought to be stress reaction during tennis game. She has not played tennis since June 3rd. Has improved a lot, pain in left foot has subsided. However she complains of heel pain that she localizes to her achilles tendon. She thinks this is related to plantar fasciitis that she has had in the past.   She is also concerned about her knees. She has had aches and pains in both knees for the past several months that come and go, sometimes occur during activity especially biking. No swelling. No injury. Running actually makes her knees feel better.  Review of Systems- as above     Objective:   Physical Exam  Well nourished well developed in NAD  Left foot- no swelling, non-tender to palpation. Neurovascularly intact.   Knees- no swelling or effusion noted bilaterally. No tenderness to palpation of joint lines. Normal ROM in flexion and extension. Lateral tracking of patella with knee extension. Knee joint stable bilaterally. Neurovascularly in tact.   Limited US of right foot demonstrates no signs of stress reaction or stress fracture.     Assessment & Plan:   Stress reaction of bone: 3rd Metatarsal - resolved -orthotics made in office today  Bilateral knee pain- likely secondary to vastus medialis weakness vs OA -orthotics as above -knee exercises given to patient to do -Body Helix patellar strap to wear with activity  Right achilles tendon pain- likely achilles tendonitis  -patient advised to do Alfredson's heel drop exercises  -follow up if pain worsens or persists  Patient was fitted for a : standard, cushioned, semi-rigid orthotic. The orthotic was heated and afterward the patient stood on the orthotic blank positioned on the orthotic stand. The patient was positioned in subtalar neutral  position and 10 degrees of ankle dorsiflexion in a weight bearing stance. After completion of molding, a stable base was applied to the orthotic blank. The blank was ground to a stable position for weight bearing. Size: 7 Base: Blue EVA Posting: none Additional orthotic padding: B/L metatarsal pads  Patient seen and evaluated with the resident. I agree with the above plan of care. Custom orthotics were created for her today. She found them to be comfortable prior to leaving the office. Total of 30 minutes was spent with the patient with greater than 50% of the time spent in face-to-face consultation discussing orthotic construction, instruction, and fitting. She will do the home exercises as above for her bilateral knee pain and right Achilles tendinitis. Follow-up as needed.

## 2016-09-15 MED ORDER — HALCINONIDE 0.1 % TOPICAL OINTMENT
Freq: Two times a day (BID) | TOPICAL | 5 refills | 0.00000 days | Status: CP
Start: 2016-09-15 — End: 2017-01-02

## 2016-09-15 MED ORDER — HALCINONIDE 0.1 % TOPICAL OINTMENT: 1 | g | Freq: Two times a day (BID) | 5 refills | 0 days | Status: AC

## 2016-09-15 MED ORDER — METRONIDAZOLE 0.75 % TOPICAL GEL
Freq: Every morning | TOPICAL | 11 refills | 0 days | Status: CP
Start: 2016-09-15 — End: 2017-09-15

## 2016-10-09 DIAGNOSIS — Z681 Body mass index (BMI) 19 or less, adult: Secondary | ICD-10-CM | POA: Diagnosis not present

## 2016-10-09 DIAGNOSIS — Z01419 Encounter for gynecological examination (general) (routine) without abnormal findings: Secondary | ICD-10-CM | POA: Diagnosis not present

## 2016-10-09 DIAGNOSIS — Z124 Encounter for screening for malignant neoplasm of cervix: Secondary | ICD-10-CM | POA: Diagnosis not present

## 2016-10-15 MED ORDER — TRIAMCINOLONE ACETONIDE 0.1 % TOPICAL CREAM
Freq: Two times a day (BID) | TOPICAL | 1 refills | 0.00000 days | Status: CP
Start: 2016-10-15 — End: 2017-01-16

## 2016-10-15 MED ORDER — PREDNISONE 10 MG TABLET
ORAL_TABLET | ORAL | 0 refills | 0.00000 days | Status: CP
Start: 2016-10-15 — End: 2017-01-02

## 2016-11-07 DIAGNOSIS — J3089 Other allergic rhinitis: Secondary | ICD-10-CM | POA: Diagnosis not present

## 2016-11-07 DIAGNOSIS — R52 Pain, unspecified: Secondary | ICD-10-CM | POA: Diagnosis not present

## 2016-11-07 DIAGNOSIS — Z681 Body mass index (BMI) 19 or less, adult: Secondary | ICD-10-CM | POA: Diagnosis not present

## 2016-11-13 ENCOUNTER — Ambulatory Visit
Admission: RE | Admit: 2016-11-13 | Discharge: 2016-11-13 | Disposition: A | Discharge status: Home / Self Care | Payer: BC Managed Care – PPO

## 2016-11-13 DIAGNOSIS — Z79899 Other long term (current) drug therapy: Secondary | ICD-10-CM

## 2016-11-13 DIAGNOSIS — L309 Dermatitis, unspecified: Principal | ICD-10-CM

## 2016-11-13 DIAGNOSIS — L2084 Intrinsic (allergic) eczema: Secondary | ICD-10-CM

## 2016-12-05 DIAGNOSIS — Z681 Body mass index (BMI) 19 or less, adult: Secondary | ICD-10-CM | POA: Diagnosis not present

## 2016-12-05 DIAGNOSIS — J0191 Acute recurrent sinusitis, unspecified: Secondary | ICD-10-CM | POA: Diagnosis not present

## 2016-12-05 DIAGNOSIS — H9319 Tinnitus, unspecified ear: Secondary | ICD-10-CM | POA: Diagnosis not present

## 2016-12-08 ENCOUNTER — Other Ambulatory Visit: Payer: Self-pay | Admitting: Obstetrics and Gynecology

## 2016-12-08 DIAGNOSIS — Z1231 Encounter for screening mammogram for malignant neoplasm of breast: Secondary | ICD-10-CM

## 2016-12-09 ENCOUNTER — Other Ambulatory Visit: Payer: Self-pay | Admitting: Internal Medicine

## 2016-12-09 DIAGNOSIS — Z0000 Encounter for general adult medical examination without abnormal findings: Secondary | ICD-10-CM | POA: Diagnosis not present

## 2016-12-09 DIAGNOSIS — M859 Disorder of bone density and structure, unspecified: Secondary | ICD-10-CM | POA: Diagnosis not present

## 2016-12-09 DIAGNOSIS — E041 Nontoxic single thyroid nodule: Secondary | ICD-10-CM | POA: Diagnosis not present

## 2016-12-09 DIAGNOSIS — J0191 Acute recurrent sinusitis, unspecified: Secondary | ICD-10-CM

## 2016-12-09 DIAGNOSIS — H9319 Tinnitus, unspecified ear: Secondary | ICD-10-CM

## 2016-12-16 ENCOUNTER — Other Ambulatory Visit: Payer: Self-pay | Admitting: Internal Medicine

## 2016-12-16 ENCOUNTER — Ambulatory Visit
Admission: RE | Admit: 2016-12-16 | Discharge: 2016-12-16 | Disposition: A | Discharge status: Home / Self Care | Payer: BLUE CROSS/BLUE SHIELD | Source: Ambulatory Visit | Attending: Internal Medicine | Admitting: Internal Medicine

## 2016-12-16 DIAGNOSIS — Z23 Encounter for immunization: Secondary | ICD-10-CM | POA: Diagnosis not present

## 2016-12-16 DIAGNOSIS — E7849 Other hyperlipidemia: Secondary | ICD-10-CM | POA: Diagnosis not present

## 2016-12-16 DIAGNOSIS — Z0000 Encounter for general adult medical examination without abnormal findings: Secondary | ICD-10-CM | POA: Diagnosis not present

## 2016-12-16 DIAGNOSIS — H9319 Tinnitus, unspecified ear: Secondary | ICD-10-CM

## 2016-12-16 DIAGNOSIS — L308 Other specified dermatitis: Secondary | ICD-10-CM | POA: Diagnosis not present

## 2016-12-16 DIAGNOSIS — J0191 Acute recurrent sinusitis, unspecified: Secondary | ICD-10-CM | POA: Diagnosis not present

## 2016-12-16 DIAGNOSIS — Z681 Body mass index (BMI) 19 or less, adult: Secondary | ICD-10-CM | POA: Diagnosis not present

## 2016-12-16 DIAGNOSIS — J329 Chronic sinusitis, unspecified: Secondary | ICD-10-CM | POA: Diagnosis not present

## 2016-12-16 DIAGNOSIS — J019 Acute sinusitis, unspecified: Secondary | ICD-10-CM | POA: Diagnosis not present

## 2016-12-16 DIAGNOSIS — Z1389 Encounter for screening for other disorder: Secondary | ICD-10-CM | POA: Diagnosis not present

## 2016-12-19 DIAGNOSIS — Z1212 Encounter for screening for malignant neoplasm of rectum: Secondary | ICD-10-CM | POA: Diagnosis not present

## 2017-01-02 ENCOUNTER — Ambulatory Visit: Admission: RE | Admit: 2017-01-02 | Discharge: 2017-01-02 | Payer: BC Managed Care – PPO | Attending: Dermatology

## 2017-01-02 DIAGNOSIS — L259 Unspecified contact dermatitis, unspecified cause: Secondary | ICD-10-CM

## 2017-01-02 DIAGNOSIS — L209 Atopic dermatitis, unspecified: Secondary | ICD-10-CM

## 2017-01-02 DIAGNOSIS — L309 Dermatitis, unspecified: Secondary | ICD-10-CM

## 2017-01-02 DIAGNOSIS — Z79899 Other long term (current) drug therapy: Secondary | ICD-10-CM

## 2017-01-02 DIAGNOSIS — E039 Hypothyroidism, unspecified: Secondary | ICD-10-CM

## 2017-01-02 DIAGNOSIS — R21 Rash and other nonspecific skin eruption: Principal | ICD-10-CM

## 2017-01-02 MED ORDER — PREDNISONE 10 MG TABLET
ORAL_TABLET | 0 refills | 0 days | Status: CP
Start: 2017-01-02 — End: 2017-01-16

## 2017-01-02 MED ORDER — CLOBETASOL 0.05 % TOPICAL OINTMENT
3 refills | 0 days | Status: CP
Start: 2017-01-02 — End: 2017-01-16

## 2017-01-02 MED ORDER — DUPILUMAB 300 MG/2 ML SUBCUTANEOUS SYRINGE
SUBCUTANEOUS | 2 refills | 0.00000 days | Status: CP
Start: 2017-01-02 — End: 2017-01-02

## 2017-01-02 MED ORDER — HYDROXYCHLOROQUINE 200 MG TABLET
ORAL_TABLET | Freq: Two times a day (BID) | ORAL | 5 refills | 0 days | Status: CP
Start: 2017-01-02 — End: 2017-02-06

## 2017-01-02 MED ORDER — HALCINONIDE 0.1 % TOPICAL OINTMENT
Freq: Two times a day (BID) | TOPICAL | 5 refills | 0 days | Status: CP
Start: 2017-01-02 — End: 2017-01-16

## 2017-01-02 MED ORDER — DUPILUMAB 300 MG/2 ML SUBCUTANEOUS SYRINGE: mL | 2 refills | 0 days

## 2017-01-05 ENCOUNTER — Ambulatory Visit
Admission: RE | Admit: 2017-01-05 | Discharge: 2017-01-05 | Disposition: A | Discharge status: Home / Self Care | Payer: BLUE CROSS/BLUE SHIELD | Source: Ambulatory Visit | Attending: Obstetrics and Gynecology | Admitting: Obstetrics and Gynecology

## 2017-01-05 DIAGNOSIS — H9193 Unspecified hearing loss, bilateral: Secondary | ICD-10-CM | POA: Diagnosis not present

## 2017-01-05 DIAGNOSIS — H903 Sensorineural hearing loss, bilateral: Secondary | ICD-10-CM | POA: Diagnosis not present

## 2017-01-05 DIAGNOSIS — H9313 Tinnitus, bilateral: Secondary | ICD-10-CM | POA: Diagnosis not present

## 2017-01-05 DIAGNOSIS — Z1231 Encounter for screening mammogram for malignant neoplasm of breast: Secondary | ICD-10-CM

## 2017-01-09 DIAGNOSIS — E039 Hypothyroidism, unspecified: Secondary | ICD-10-CM | POA: Diagnosis not present

## 2017-01-09 DIAGNOSIS — E559 Vitamin D deficiency, unspecified: Secondary | ICD-10-CM | POA: Diagnosis not present

## 2017-01-09 DIAGNOSIS — L309 Dermatitis, unspecified: Secondary | ICD-10-CM | POA: Diagnosis not present

## 2017-01-09 DIAGNOSIS — R5383 Other fatigue: Secondary | ICD-10-CM | POA: Diagnosis not present

## 2017-01-09 DIAGNOSIS — Z72820 Sleep deprivation: Secondary | ICD-10-CM | POA: Diagnosis not present

## 2017-01-13 NOTE — Unmapped (Signed)
Per test claim for Dupixent at the  [facility], approved for $0 with copay card

## 2017-01-16 ENCOUNTER — Ambulatory Visit
Admission: RE | Admit: 2017-01-16 | Discharge: 2017-01-16 | Payer: BC Managed Care – PPO | Attending: Dermatology | Admitting: Dermatology

## 2017-01-16 DIAGNOSIS — R21 Rash and other nonspecific skin eruption: Principal | ICD-10-CM

## 2017-01-16 MED ORDER — PREDNISONE 10 MG TABLET
ORAL_TABLET | 0 refills | 0 days | Status: CP
Start: 2017-01-16 — End: 2017-04-10

## 2017-01-16 MED ORDER — PREDNISONE 5 MG TABLET
ORAL_TABLET | 0 refills | 0 days | Status: CP
Start: 2017-01-16 — End: 2017-04-10

## 2017-01-16 NOTE — Unmapped (Signed)
Taper your prednisone by 5mg  every week.    Week of 12/17: 25mg   Week of 12/24: 20mg   Week of 12/31: 15mg   Week of 1/7: 10mg   Week of 1/14: 5mg 

## 2017-01-16 NOTE — Unmapped (Signed)
DERMATOLOGY FOLLOW-UP NOTE    Assessment and Plan:    Dermatitis of hands and forearms: Improved but still significant     Assessment:  - Given the lack of eosinophils on exam, I do not favor a diagnosis of ACD. The presence of dermal mucin would also be atypical for ACD. She did have patch testing in [place] in 2015 which was negative however she had received IM steroids prior  - The distribution of her dorsal rash (sparing of the PCP and MCP joints) as well as the presence of dermal mucin raises the question of SCLE or another autoimmune CTD. However, the lack of involvement of other photoexposed areas (face, chest) is atypical of this. Of note, her ANA, ENA, and dsDNA were negative in 12/2015.   - We did discuss repeating a biopsy today. However, since she is on prednisone this may be non-diagnostic and patient preferred to defer this.  - Patient is seeing a new complementary medicine doctor in [place]. Due to this, we will not make any major changes to her treatment plan at this time (based on her preference). We will continue a slow prednisone taper (outlined below) to help her through the holidays. She will continue on plaquenil.  - Other treatment considerations in the future include retrial of Cellcept, Dupilumab (if we favor ACD/atopic)  - I do think repeat biopsy would be helpful once she is off prednisone    Plan:  - Taper prednisone by 5mg  weekly (starting at 30mg )  - Cont plaquenil 200mg  BID    High risk medication use (Plaquenil):  - Last optho appt 12/2015. Patient advised to see her ophthalmologist annually while on Plaquenil.    RTC: Return in about 3 weeks (around 02/06/2017).  ______________________________________________________________________    CC:  F/u hand dermatitis    HPI: This is a 61 y.o. female last seen by Dr. Orma Flaming in 12/2016 for dermatitis predominantly involving the hands. Her diagnosis has remained elusive. For complete history see below. At [facility], her eruption was thought to be atopic dermatitis with a secondary component of ACD. However, given the presence of the mucin on prior biopsies and photo exacerbated nature of this condition she was continued on plaquenil 200mg  BID for consideration of connective tissue disease. She was also initiated on a prednisone taper starting at 60mg . She is currently on 30mg  as part of her prednisone taper. We had also discussed initiating dupilumab which she has not yet started.     After approximately 1 week of prednisone, her rash began to improve with desquamation of her palms. Her pruritus also significantly improved and she reports no itch for the past week. However, her hands have remained red and scaly. She denies any rashes anywhere else on her body in particular sun exposed areas on her face, upper chest. Denies any scaling of the scalp. Her ROS is otherwise negative. In particular, she denies joint aches, fatigue, photosensitivity.     Of note, she has seen a naturopathic physician and they are testing for mold.    For history: Patient notes that she first developed an itchy rash on her hands, arms, neck, and legs several years ago. Since then, she has seen 6 providers for a recurring itchy rash. Prior treatments include topical steroids, prednisone, permethrin, Cellcept (maxed dose with partial response). She has had prior patch testing in July 2015 in [place] (65 patches, all of which were negative -- however, she thinks she may have had IM steroids a couple of weeks beforehand).  She typically flares in the summer with sunlight.    Biopsy reports 12/05/15:  Final Diagnosis  Hand, punch  - Mild spongiosis with slight superficial dermal perivascular lymphocytic infiltrate  - Dermal mucinosis    Final Diagnosis  Left hand, perilesional biopsy for direct immunofluorescence  - Positive direct immunofluorescence with granular deposition of immunoglobulins and C3 at the BMZ (see Comment)    Pertinent PMH:  Eczema    Social Hx:  Enjoys tennis and traveling    ROS: Baseline state of health.  Denies fevers, chills. No other skin complaints except noted per HPI.     PE:  Gen: WD, WN, NAD  Neuro: A&O  Skin: Focused examination of the head, neck, bilateral upper extremities including hands was performed and significant for the below. All other areas examined were normal or had no significant findings.  - Well demarcated erythematous somewhat scaly plaques on the dorsum of the hands with sparing of the PIP and MCP joints  - Waxy orange to erythematous plaques with significant thick scaling on the b/l palms  - No nail changes noted   - All other areas examined were normal or no significant findings.        _____________________________________________________________    The patient was seen and examined by Essie Christine, MD who agrees with the assessment and plan as above.

## 2017-01-19 NOTE — Unmapped (Signed)
I saw and evaluated the patient, participating in the key portions of the service.  I reviewed the resident???s note.  I agree with the resident???s findings and plan.    Essie Christine, MD

## 2017-01-29 DIAGNOSIS — H40053 Ocular hypertension, bilateral: Secondary | ICD-10-CM | POA: Diagnosis not present

## 2017-02-06 ENCOUNTER — Encounter
Admit: 2017-02-06 | Discharge: 2017-02-07 | Payer: PRIVATE HEALTH INSURANCE | Attending: Dermatology | Primary: Dermatology

## 2017-02-06 DIAGNOSIS — L309 Dermatitis, unspecified: Principal | ICD-10-CM

## 2017-02-06 MED ORDER — HYDROXYCHLOROQUINE 200 MG TABLET
ORAL_TABLET | Freq: Two times a day (BID) | ORAL | 5 refills | 0 days | Status: CP
Start: 2017-02-06 — End: 2017-04-10

## 2017-02-06 NOTE — Unmapped (Signed)
Continue prednisone taper, 15mg  daily for total of 3 days then 5mg  daily for 3 days then stop.    Continue Plaquenil 200mg  twice daily.    Continue with the low dose Naltrexone per Integrative medicine.

## 2017-02-06 NOTE — Unmapped (Signed)
DERMATOLOGY FOLLOW-UP NOTE    Assessment and Plan:    Dermatitis of hands and forearms: Clinically resolved today. Based on prior biopsy, ddx has included eczematous dermatitis vs SCLE or other connective tissue disease  - Given the lack of eosinophils on exam, do not favor a diagnosis of ACD. The presence of dermal mucin would also be atypical for ACD. She did have patch testing in [place] in 2015 which was negative however she had received IM steroids prior  - The distribution of her dorsal rash (sparing of the PCP and MCP joints) as well as the presence of dermal mucin and flaring with phototherapy raises the question of SCLE or another autoimmune CTD. However, the lack of involvement of other photoexposed areas (face, chest) is atypical of this. Of note, her ANA, ENA, and dsDNA were negative in 12/2015.   - Could consider re-biopsy of rash if it returns once off prednisone.    Plan:  - Continue prednisone taper 15mg  daily for 2 days,  10mg  daily for 2 days, then 5mg  daily for 2 days, then stop  - Cont plaquenil 200mg  BID until next visit. Discussed possibility of stopping at that time if things remain well-controlled.  - OK to continue low-dose naltrexone per integrative medicine doctor    High risk medication use (Plaquenil):  - Patient advised to see her ophthalmologist annually while on Plaquenil.    RTC: Return in about 8 weeks (around 04/03/2017) for Recheck.  ______________________________________________________________________    CC:  Established patient, here for f/u hand dermatitis    HPI: This is a 62 y.o. female last seen by Dr. Orma Flaming in 12/2016 for dermatitis predominantly involving the hands. Her diagnosis has remained elusive but ddx has included eczematous dermatitis vs SCLE or other connective tissue disease based on prior biopsies. For complete history see below. At last visit, continued Plaquenil 200mg  BID and and tapered prednisone from 30mg , now down to 15mg  daily. She also saw an Integrative Medicine physician and was started on low-dose naltrexone 3mg  daily in early December. Today she notes that her rash has improved and not with any activity today, no recent red, itchy rash on hands as she usually has, but she is having intolerable side effects from the prednisone including insomnia and anxiety and would like to taper as quickly as possible. She has no other skin concerns today.    For history: Patient notes that she first developed an itchy rash on her hands, arms, neck, and legs several years ago. Since then, she has seen 6 providers for a recurring itchy rash. Prior treatments include topical steroids, prednisone, permethrin, Cellcept (maxed dose with partial response). She has had prior patch testing in July 2015 in [place] (65 patches, all of which were negative -- however, she thinks she may have had IM steroids a couple of weeks beforehand). She typically flares in the summer with sunlight.    Biopsy reports 12/05/15:  Final Diagnosis  Hand, punch  - Mild spongiosis with slight superficial dermal perivascular lymphocytic infiltrate  - Dermal mucinosis    Final Diagnosis  Left hand, perilesional biopsy for direct immunofluorescence  - Positive direct immunofluorescence with granular deposition of immunoglobulins and C3 at the BMZ (see Comment)    Pertinent PMH:  Eczema    Social Hx:  Enjoys tennis and traveling    ROS: Baseline state of health.  Denies fevers, chills. No other skin complaints except noted per HPI.     PE:  Gen: WD, WN, NAD  Neuro: A&O  Skin: Focused examination of the head, neck, bilateral upper extremities including hands was performed and significant for the below. All other areas examined were normal or had no significant findings.  - Slight blanchable erythema of bilateral hands  - No evidence of rash (scale) on hands today  - All other areas examined were normal or no significant findings.  _____________________________________________________________    The patient was seen and examined by Dr. Orma Flaming who agrees with the assessment and plan as above.

## 2017-02-16 DIAGNOSIS — R5383 Other fatigue: Secondary | ICD-10-CM | POA: Diagnosis not present

## 2017-02-16 DIAGNOSIS — Z72820 Sleep deprivation: Secondary | ICD-10-CM | POA: Diagnosis not present

## 2017-02-16 DIAGNOSIS — L309 Dermatitis, unspecified: Secondary | ICD-10-CM | POA: Diagnosis not present

## 2017-02-16 DIAGNOSIS — Z77120 Contact with and (suspected) exposure to mold (toxic): Secondary | ICD-10-CM | POA: Diagnosis not present

## 2017-02-25 DIAGNOSIS — H40053 Ocular hypertension, bilateral: Secondary | ICD-10-CM | POA: Diagnosis not present

## 2017-03-18 DIAGNOSIS — R5383 Other fatigue: Secondary | ICD-10-CM | POA: Diagnosis not present

## 2017-03-18 DIAGNOSIS — Z72820 Sleep deprivation: Secondary | ICD-10-CM | POA: Diagnosis not present

## 2017-03-18 DIAGNOSIS — L309 Dermatitis, unspecified: Secondary | ICD-10-CM | POA: Diagnosis not present

## 2017-03-18 DIAGNOSIS — Z77120 Contact with and (suspected) exposure to mold (toxic): Secondary | ICD-10-CM | POA: Diagnosis not present

## 2017-04-10 ENCOUNTER — Encounter: Admit: 2017-04-10 | Discharge: 2017-04-11 | Payer: PRIVATE HEALTH INSURANCE

## 2017-04-10 DIAGNOSIS — L309 Dermatitis, unspecified: Principal | ICD-10-CM

## 2017-04-10 MED ORDER — HYDROXYCHLOROQUINE 200 MG TABLET
ORAL_TABLET | Freq: Two times a day (BID) | ORAL | 5 refills | 0 days | Status: CP
Start: 2017-04-10 — End: 2018-04-10

## 2017-04-15 DIAGNOSIS — L309 Dermatitis, unspecified: Secondary | ICD-10-CM | POA: Diagnosis not present

## 2017-04-15 DIAGNOSIS — Z77120 Contact with and (suspected) exposure to mold (toxic): Secondary | ICD-10-CM | POA: Diagnosis not present

## 2017-04-15 DIAGNOSIS — Z72820 Sleep deprivation: Secondary | ICD-10-CM | POA: Diagnosis not present

## 2017-04-15 DIAGNOSIS — R5383 Other fatigue: Secondary | ICD-10-CM | POA: Diagnosis not present

## 2017-04-28 DIAGNOSIS — N951 Menopausal and female climacteric states: Secondary | ICD-10-CM | POA: Diagnosis not present

## 2017-04-28 DIAGNOSIS — R5383 Other fatigue: Secondary | ICD-10-CM | POA: Diagnosis not present

## 2017-05-25 ENCOUNTER — Ambulatory Visit (INDEPENDENT_AMBULATORY_CARE_PROVIDER_SITE_OTHER): Payer: BLUE CROSS/BLUE SHIELD | Admitting: Sports Medicine

## 2017-05-25 ENCOUNTER — Encounter: Payer: Self-pay | Admitting: Sports Medicine

## 2017-05-25 VITALS — BP 120/70 | Ht 64.0 in | Wt 115.0 lb

## 2017-05-25 DIAGNOSIS — M25579 Pain in unspecified ankle and joints of unspecified foot: Secondary | ICD-10-CM

## 2017-05-25 NOTE — Progress Notes (Signed)
   Subjective:    Patient ID: Tasha Jackson, female    DOB: 1955-06-21, 62 y.o.   MRN: [id number]  HPI chief complaint: Bilateral foot pain  Patient comes in today complaining of several months of bilateral foot pain. Her pain is diffuse. It is present across the dorsum of the foot as well as in the arch. She also has some pain along the right Achilles tendon. We created custom orthotics for her in July of last year. These were initially very comfortable but have become more painful over the past several weeks. She has found the most comfortable thing to be a sandal with a good arch support. No recent trauma. She does have a history of metatarsal stress reactions in the past. Also a history of plantar fasciitis.    Review of Systems As above    Objective:   Physical Exam  Well-developed, well-nourished. No acute distress  Examination of both feet show fairly well-preserved longitudinal arch but she has collapse of the transverse arches bilaterally. Small bunionette on the left. No soft tissue swelling. There is some tenderness to palpation along the distal right Achilles tendon but no thickening.      Assessment & Plan:   Diffuse bilateral foot pain Right Achilles tendinopathy  Patient's current tennis shoes are 60 years old. I recommended that she get new shoes with a wider toe box. She will return to the office tomorrow to have her shoes fitted with a green sports insole and scaphoid pad bilaterally. Follow-up with me 4 weeks later. If her symptoms improve then I will probably not make her new custom orthotics. Her current orthotics are in pretty good shape. However, since she did get good initial improvement with her custom orthotics last summer, I may reconsider constructing a new pair if she continues to have pain. She will also start eccentric heel drop exercises for her Achilles tendinopathy.

## 2017-05-26 DIAGNOSIS — H40013 Open angle with borderline findings, low risk, bilateral: Secondary | ICD-10-CM | POA: Diagnosis not present

## 2017-06-02 DIAGNOSIS — H40013 Open angle with borderline findings, low risk, bilateral: Secondary | ICD-10-CM | POA: Diagnosis not present

## 2017-06-02 DIAGNOSIS — H40053 Ocular hypertension, bilateral: Secondary | ICD-10-CM | POA: Diagnosis not present

## 2017-06-24 DIAGNOSIS — R238 Other skin changes: Secondary | ICD-10-CM | POA: Diagnosis not present

## 2017-06-24 DIAGNOSIS — D229 Melanocytic nevi, unspecified: Secondary | ICD-10-CM | POA: Diagnosis not present

## 2017-06-24 DIAGNOSIS — L820 Inflamed seborrheic keratosis: Secondary | ICD-10-CM | POA: Diagnosis not present

## 2017-06-24 DIAGNOSIS — L308 Other specified dermatitis: Secondary | ICD-10-CM | POA: Diagnosis not present

## 2017-06-24 DIAGNOSIS — L309 Dermatitis, unspecified: Secondary | ICD-10-CM | POA: Diagnosis not present

## 2017-07-14 DIAGNOSIS — N951 Menopausal and female climacteric states: Secondary | ICD-10-CM | POA: Diagnosis not present

## 2017-07-14 DIAGNOSIS — L309 Dermatitis, unspecified: Secondary | ICD-10-CM | POA: Diagnosis not present

## 2017-07-14 DIAGNOSIS — R5383 Other fatigue: Secondary | ICD-10-CM | POA: Diagnosis not present

## 2017-07-14 DIAGNOSIS — Z72820 Sleep deprivation: Secondary | ICD-10-CM | POA: Diagnosis not present

## 2017-07-30 DIAGNOSIS — L309 Dermatitis, unspecified: Secondary | ICD-10-CM | POA: Diagnosis not present

## 2017-08-31 DIAGNOSIS — L309 Dermatitis, unspecified: Secondary | ICD-10-CM | POA: Diagnosis not present

## 2017-09-14 DIAGNOSIS — L309 Dermatitis, unspecified: Secondary | ICD-10-CM | POA: Diagnosis not present

## 2017-09-18 DIAGNOSIS — L309 Dermatitis, unspecified: Secondary | ICD-10-CM | POA: Diagnosis not present

## 2017-10-02 ENCOUNTER — Encounter: Admit: 2017-10-02 | Discharge: 2017-10-03 | Payer: PRIVATE HEALTH INSURANCE

## 2017-10-02 DIAGNOSIS — L570 Actinic keratosis: Principal | ICD-10-CM

## 2017-10-02 DIAGNOSIS — L710 Perioral dermatitis: Secondary | ICD-10-CM

## 2017-10-02 DIAGNOSIS — L309 Dermatitis, unspecified: Secondary | ICD-10-CM

## 2017-10-02 DIAGNOSIS — L308 Other specified dermatitis: Secondary | ICD-10-CM | POA: Diagnosis not present

## 2017-10-13 DIAGNOSIS — H40013 Open angle with borderline findings, low risk, bilateral: Secondary | ICD-10-CM | POA: Diagnosis not present

## 2017-11-05 DIAGNOSIS — Z01419 Encounter for gynecological examination (general) (routine) without abnormal findings: Secondary | ICD-10-CM | POA: Diagnosis not present

## 2017-11-05 DIAGNOSIS — Z681 Body mass index (BMI) 19 or less, adult: Secondary | ICD-10-CM | POA: Diagnosis not present

## 2017-11-17 DIAGNOSIS — Z681 Body mass index (BMI) 19 or less, adult: Secondary | ICD-10-CM | POA: Diagnosis not present

## 2017-11-17 DIAGNOSIS — N760 Acute vaginitis: Secondary | ICD-10-CM | POA: Diagnosis not present

## 2017-12-01 DIAGNOSIS — Z681 Body mass index (BMI) 19 or less, adult: Secondary | ICD-10-CM | POA: Diagnosis not present

## 2017-12-01 DIAGNOSIS — N952 Postmenopausal atrophic vaginitis: Secondary | ICD-10-CM | POA: Diagnosis not present

## 2017-12-01 DIAGNOSIS — N760 Acute vaginitis: Secondary | ICD-10-CM | POA: Diagnosis not present

## 2017-12-07 ENCOUNTER — Other Ambulatory Visit: Payer: Self-pay | Admitting: Obstetrics and Gynecology

## 2017-12-07 DIAGNOSIS — Z1231 Encounter for screening mammogram for malignant neoplasm of breast: Secondary | ICD-10-CM

## 2017-12-15 DIAGNOSIS — E041 Nontoxic single thyroid nodule: Secondary | ICD-10-CM | POA: Diagnosis not present

## 2017-12-15 DIAGNOSIS — R82998 Other abnormal findings in urine: Secondary | ICD-10-CM | POA: Diagnosis not present

## 2017-12-15 DIAGNOSIS — M810 Age-related osteoporosis without current pathological fracture: Secondary | ICD-10-CM | POA: Diagnosis not present

## 2017-12-15 DIAGNOSIS — Z79899 Other long term (current) drug therapy: Secondary | ICD-10-CM | POA: Diagnosis not present

## 2017-12-15 DIAGNOSIS — Z0000 Encounter for general adult medical examination without abnormal findings: Secondary | ICD-10-CM | POA: Diagnosis not present

## 2017-12-22 DIAGNOSIS — J0191 Acute recurrent sinusitis, unspecified: Secondary | ICD-10-CM | POA: Diagnosis not present

## 2017-12-22 DIAGNOSIS — Z1389 Encounter for screening for other disorder: Secondary | ICD-10-CM | POA: Diagnosis not present

## 2017-12-22 DIAGNOSIS — E7849 Other hyperlipidemia: Secondary | ICD-10-CM | POA: Diagnosis not present

## 2017-12-22 DIAGNOSIS — Z0000 Encounter for general adult medical examination without abnormal findings: Secondary | ICD-10-CM | POA: Diagnosis not present

## 2017-12-22 DIAGNOSIS — G4709 Other insomnia: Secondary | ICD-10-CM | POA: Diagnosis not present

## 2017-12-22 DIAGNOSIS — Z79899 Other long term (current) drug therapy: Secondary | ICD-10-CM | POA: Diagnosis not present

## 2017-12-22 DIAGNOSIS — Z23 Encounter for immunization: Secondary | ICD-10-CM | POA: Diagnosis not present

## 2017-12-23 ENCOUNTER — Other Ambulatory Visit: Payer: Self-pay | Admitting: Internal Medicine

## 2017-12-23 DIAGNOSIS — E041 Nontoxic single thyroid nodule: Secondary | ICD-10-CM

## 2017-12-28 ENCOUNTER — Ambulatory Visit
Admission: RE | Admit: 2017-12-28 | Discharge: 2017-12-28 | Disposition: A | Discharge status: Home / Self Care | Payer: BLUE CROSS/BLUE SHIELD | Source: Ambulatory Visit | Attending: Internal Medicine | Admitting: Internal Medicine

## 2017-12-28 DIAGNOSIS — E041 Nontoxic single thyroid nodule: Secondary | ICD-10-CM | POA: Diagnosis not present

## 2018-01-15 ENCOUNTER — Ambulatory Visit: Payer: BLUE CROSS/BLUE SHIELD

## 2018-01-19 ENCOUNTER — Ambulatory Visit
Admission: RE | Admit: 2018-01-19 | Discharge: 2018-01-19 | Disposition: A | Discharge status: Home / Self Care | Payer: BLUE CROSS/BLUE SHIELD | Source: Ambulatory Visit | Attending: Obstetrics and Gynecology | Admitting: Obstetrics and Gynecology

## 2018-01-19 ENCOUNTER — Inpatient Hospital Stay: Admission: RE | Admit: 2018-01-19 | Payer: BLUE CROSS/BLUE SHIELD | Source: Ambulatory Visit

## 2018-01-19 DIAGNOSIS — Z1231 Encounter for screening mammogram for malignant neoplasm of breast: Secondary | ICD-10-CM | POA: Diagnosis not present

## 2018-03-30 ENCOUNTER — Ambulatory Visit (INDEPENDENT_AMBULATORY_CARE_PROVIDER_SITE_OTHER): Payer: BLUE CROSS/BLUE SHIELD | Admitting: Sports Medicine

## 2018-03-30 ENCOUNTER — Ambulatory Visit: Payer: Self-pay

## 2018-03-30 VITALS — BP 135/86 | Ht 65.0 in | Wt 112.0 lb

## 2018-03-30 DIAGNOSIS — M25512 Pain in left shoulder: Secondary | ICD-10-CM

## 2018-03-30 MED ORDER — DICLOFENAC SODIUM 1 % TD GEL: 2.0000 g | Freq: Two times a day (BID) | TRANSDERMAL | 1 refills | Status: AC | PRN

## 2018-03-30 NOTE — Progress Notes (Signed)
HPI  CC: Left shoulder pain  Tasha Jackson is a 63-year-old female who presents for left shoulder pain.  She states his pain is been present for around 1 month.  She states she was doing preacher curls at the gym 1 month ago.  She states did not notice any immediate pain, but noticed pain around 5 to 6 hours after doing this exercise.  She has no prior injury to this shoulder.  She does have a history of impingement shoulder.  She has no pain with overhead activity.  She does have pain at nighttime.  She has not tried over-the-counter medications up to this point.  She has been resting the shoulder with some relief.  She denies any trauma to the area.  She denies any numbness tingling in her arm.  She denies any weakness in handgrip.  No prior surgeries on the shoulder.  She is starting back in a tennis league next week.  See HPI and/or previous note for associated ROS.  Objective: BP 135/86   Ht 5\' 5"  (1.651 m)   Wt 112 lb (50.8 kg)   LMP 12/10/2010   BMI 18.64 kg/m  Gen: Right-Hand Dominant. NAD, well groomed, a/o x3, normal affect.  CV: Well-perfused. Warm.  Resp: Non-labored.  Neuro: Sensation intact throughout. No gross coordination deficits.  Gait: Nonpathologic posture, unremarkable stride without signs of limp or balance issues.  Left shoulder exam: No erythema, warmth, swelling noted.  Tenderness palpation of the bicipital groove.  Full range of motion in forward flexion, abduction, external rotation.  Internal rotation to back pocket.  Pain with internal rotation.  Strength out of 5 throughout testing.  Positive speeds test, negative empty can test, negative Hocking's test, positive liftoff test, positive belly press off test, negative crossover test, negative O'Brien's test.  ULTRASOUND: Shoulder, left Diagnostic complete ultrasound imaging obtained of patient's left shoulder.  - No obvious evidence of bony deformity or osteophyte development appreciated.  - Long head of the biceps  tendon: No evidence of tendon thickening, calcification, subluxation, or tearing in short or long axis views.  Bull's-eye sign noted around tendon. - Subscapularis tendon: complete visualization across the width of the insertion point yielded evidence of fraying with hypoechoic changes and evidence of calcification.  Some fluid noted around the area.  No obvious tears noted..  - Supraspinatus tendon: complete visualization across the width of the insertion point yielded no evidence of tendon thickening, calcification, or tears in the long axis view. No evidence of bursal inflammation appreciated.  - Infraspinatus and teres minor tendons: visualization across the width of the insertion points yielded no evidence of tendon thickening, calcification, or tears in the long axis view.  [facility] Joint: No evidence of joint separation, collapse, or osteophyte development appreciated.  Small effusion noted of her AC joint. IMPRESSION: findings consistent with subscapularis tendinopathy and biceps tendon tendinopathy.   Assessment and plan: Left shoulder pain, likely secondary to subscapularis tendinopathy with a component of biceps tendon tendinopathy.  We discussed treatment options at today's visit.  We will start her in formal physical therapy at today's visit for rotator cuff strengthening conditioning, as well as biceps tendinopathy.  She has difficulty taking NSAIDs by mouth, so we will provide her Voltaren cream today over the affected area.  We did offer her an injection if she feels like the pain is interfering with her day-to-day life.  She declines injection at today's visit.  If she has no improvement at follow-up in 4 weeks, I  will consider doing a shoulder injection.  She has worsening symptoms with weakness and worsening pain, I will consider getting MRI of the shoulder.  Lewanda Rife, MD [place] [facility] 03/30/2018 3:15 PM  Patient seen and evaluated with the sports medicine  fellow.  I agree with the above plan of care.  Ultrasound findings as above.  Start physical therapy and follow-up in 4 weeks.  If symptoms persist, consider merits of further diagnostic imaging.

## 2018-03-31 ENCOUNTER — Encounter: Payer: Self-pay | Admitting: Sports Medicine

## 2018-04-12 DIAGNOSIS — M25512 Pain in left shoulder: Secondary | ICD-10-CM | POA: Diagnosis not present

## 2018-04-12 DIAGNOSIS — M7502 Adhesive capsulitis of left shoulder: Secondary | ICD-10-CM | POA: Diagnosis not present

## 2018-04-13 ENCOUNTER — Ambulatory Visit (INDEPENDENT_AMBULATORY_CARE_PROVIDER_SITE_OTHER): Payer: BLUE CROSS/BLUE SHIELD | Admitting: Sports Medicine

## 2018-04-13 VITALS — BP 116/78 | Ht 64.0 in | Wt 112.0 lb

## 2018-04-13 DIAGNOSIS — M7582 Other shoulder lesions, left shoulder: Secondary | ICD-10-CM | POA: Diagnosis not present

## 2018-04-13 MED ORDER — METHYLPREDNISOLONE ACETATE 40 MG/ML IJ SUSP
40.0000 mg | Freq: Once | INTRAMUSCULAR | Status: AC
  Administered 2018-04-13: 40 mg via INTRA_ARTICULAR

## 2018-04-13 NOTE — Progress Notes (Signed)
   HPI  CC: Left shoulder pain  Tasha Jackson is a 63 year old female who presents for left shoulder pain.  She was last seen on 03/30/2018.  At that time she was given home exercises.  She was also given Voltaren cream to put over the area.  She states this did not help much.  She states the pain is worse when she lies over the affected side at nighttime.  She also reports pain with overhead activity.  She denies any weakness in the arm.  She denies any numbness and tingling down the arm.  She denies any recent fevers or chills.  She recently went to [place] with her daughter, and was caring a lot of their luggage.  She states that her shoulder pain got worse during that time.  She does not recall any new trauma to the area.  See HPI and/or previous note for associated ROS.  Objective: BP 116/78   Ht 5\' 4"  (1.626 m)   Wt 112 lb (50.8 kg)   LMP 12/10/2010   BMI 19.22 kg/m  Gen: Right-Hand Dominant. NAD, well groomed, a/o x3, normal affect.  CV: Well-perfused. Warm.  Resp: Non-labored.  Neuro: Sensation intact throughout. No gross coordination deficits.  Gait: Nonpathologic posture, unremarkable stride without signs of limp or balance issues.  Left shoulder exam: No erythema, warmth, swelling noted.  Tenderness palpation of the bicipital groove.  Tenderness palpation of the posterior shoulder.  Full range of motion in flexion, abduction.  Back pocket and internal rotation.  Full range of motion external rotation.  Strength out of 5 throughout testing.  Positive Hawkin's test.  Negative speeds test, negative empty can test, negative belly press off test, positive liftoff test.  Assessment and plan: Left shoulder pain, likely secondary to rotator cuff tendinopathy.  Ultrasound from 03/30/2018 showed hypoechoic change along the subscapularis tendon.  INJECTION: Patient was given informed consent, signed copy in the chart. Appropriate time out was taken. Area prepped and draped in usual sterile  fashion.  1 cc of methylprednisolone 40 mg/ml plus 3 cc of 1% lidocaine without epinephrine was injected into the left subacromial space using a(n) posterior lateral approach. The patient tolerated the procedure well. There were no complications. Post procedure instructions were given.  We discussed treatment options at today's visit.  I gave her a corticosteroid injection to the subacromial space in her left shoulder today's visit.  She tolerated procedure well.  Hopefully this to give her some lasting relief.  We discussed that if she returns in 4 weeks, and continues to have shoulder pain, we will obtain an MRI to further evaluate the issue.  I do recommend she takes 1 week off from tennis after the steroid injection.  We will see her follow-up in 3 weeks. Lewanda Rife, MD [place] [facility] 04/13/2018 3:31 PM  Patient seen and evaluated with the sports medicine fellow.  I agree with the above plan of care.  Cortisone injection administered as above.  Follow-up with me in 3 weeks.  If symptoms persist, consider merits of further diagnostic imaging at that time.  Call with questions or concerns in the interim.

## 2018-04-15 DIAGNOSIS — M25512 Pain in left shoulder: Secondary | ICD-10-CM | POA: Diagnosis not present

## 2018-04-15 DIAGNOSIS — M7502 Adhesive capsulitis of left shoulder: Secondary | ICD-10-CM | POA: Diagnosis not present

## 2018-04-16 DIAGNOSIS — H40013 Open angle with borderline findings, low risk, bilateral: Secondary | ICD-10-CM | POA: Diagnosis not present

## 2018-04-27 ENCOUNTER — Ambulatory Visit: Payer: BLUE CROSS/BLUE SHIELD | Admitting: Sports Medicine

## 2018-05-04 ENCOUNTER — Ambulatory Visit: Payer: BLUE CROSS/BLUE SHIELD | Admitting: Sports Medicine

## 2018-06-01 ENCOUNTER — Encounter: Payer: Self-pay | Admitting: Sports Medicine

## 2018-06-01 ENCOUNTER — Ambulatory Visit (INDEPENDENT_AMBULATORY_CARE_PROVIDER_SITE_OTHER): Payer: BLUE CROSS/BLUE SHIELD | Admitting: Sports Medicine

## 2018-06-01 ENCOUNTER — Other Ambulatory Visit: Payer: Self-pay

## 2018-06-01 DIAGNOSIS — M25512 Pain in left shoulder: Secondary | ICD-10-CM | POA: Diagnosis not present

## 2018-06-02 ENCOUNTER — Telehealth: Payer: Self-pay | Admitting: Sports Medicine

## 2018-06-02 NOTE — Progress Notes (Signed)
  This visit was provided via telemedicine Consent was obtained Patient was located at home Provider was located in his office Patient and provider were the only ones involved in this telemedicine service  I spoke with Tasha Jackson today in follow-up for left shoulder pain.  Unfortunately, she continues to struggle despite subacromial cortisone injection and home exercise program.  She has been compliant with her home exercises.  She was able to attend 4 physical therapy sessions before PT close down as a result of COVID-19.  Symptoms have been present now for about 4 months.  Previous ultrasound performed here in our office showed fluid around the biceps tendon as well as tendinopathy of the subscapularis tendon.  Physical exam was deferred due to the nature of this visit  Impression/plan:  Persistent right shoulder pain worrisome for rotator cuff tear versus labral tear  Although the patient's bedside ultrasound did not show any evidence of rotator cuff tearing, she did demonstrate fluid around the proximal biceps tendon which can be an indirect sign of labral pathology.  Given her persistent symptoms despite conservative treatment to date, I would like to proceed with an MRI of her left shoulder to evaluate further.  Phone follow-up with those findings when available.  We will delineate further treatment based on those results.

## 2018-06-03 NOTE — Addendum Note (Signed)
Addended by: Cyd Silence on: 06/03/2018 08:58 AM   Modules accepted: Orders

## 2018-06-13 IMAGING — MG 2D DIGITAL SCREENING BILATERAL MAMMOGRAM WITH CAD AND ADJUNCT TO
9 of 12 series · 9 of 28 positions shown · non-contrast
Comparison: Previous exam(s).

CLINICAL DATA: Screening.

EXAM:
2D DIGITAL SCREENING BILATERAL MAMMOGRAM WITH CAD AND ADJUNCT TOMO

[R MLO]
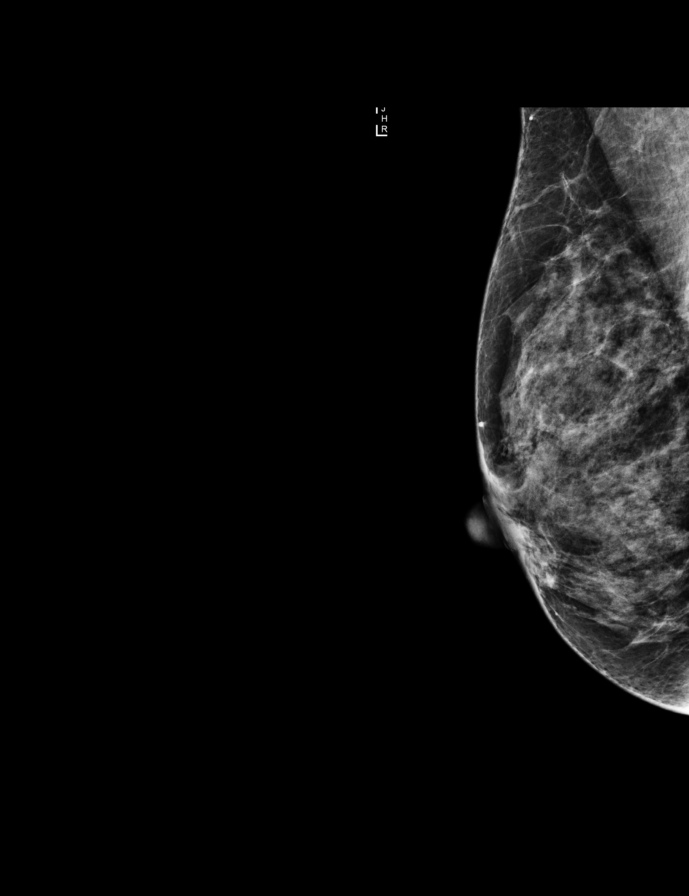

[L CC]
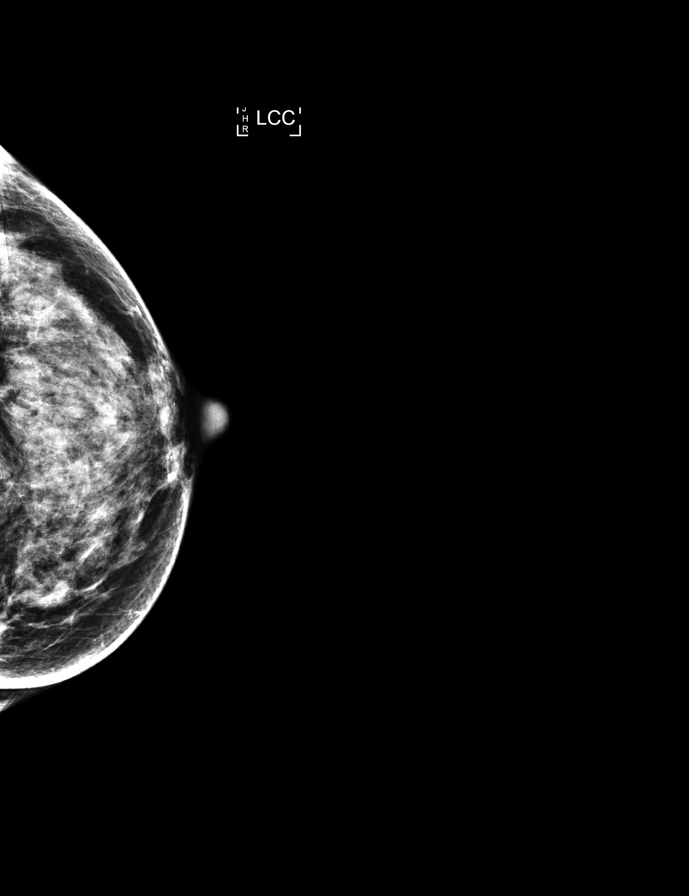

[R MLO synth-2D]
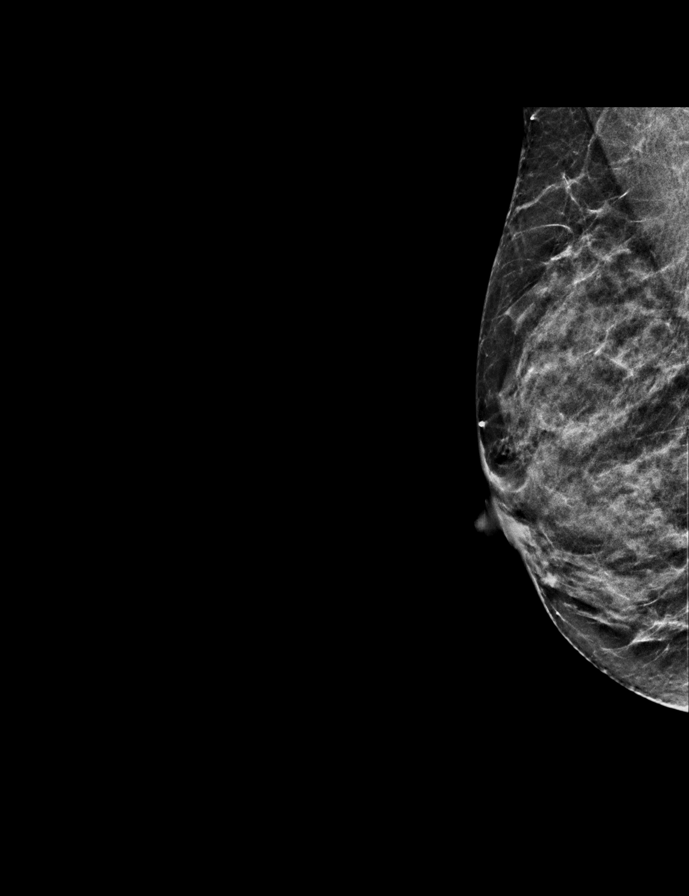

[L CC synth-2D]
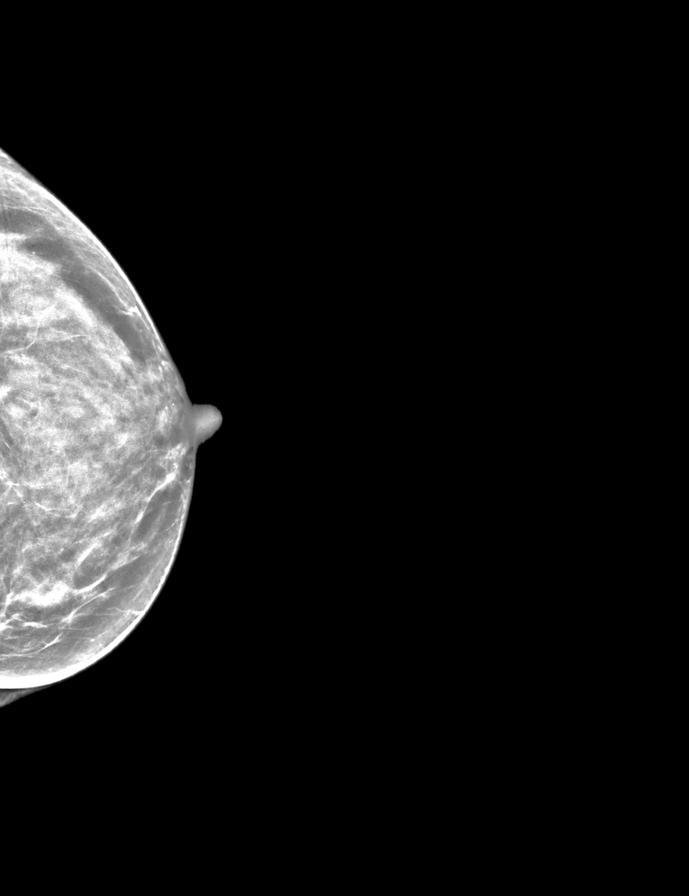

[L MLO]
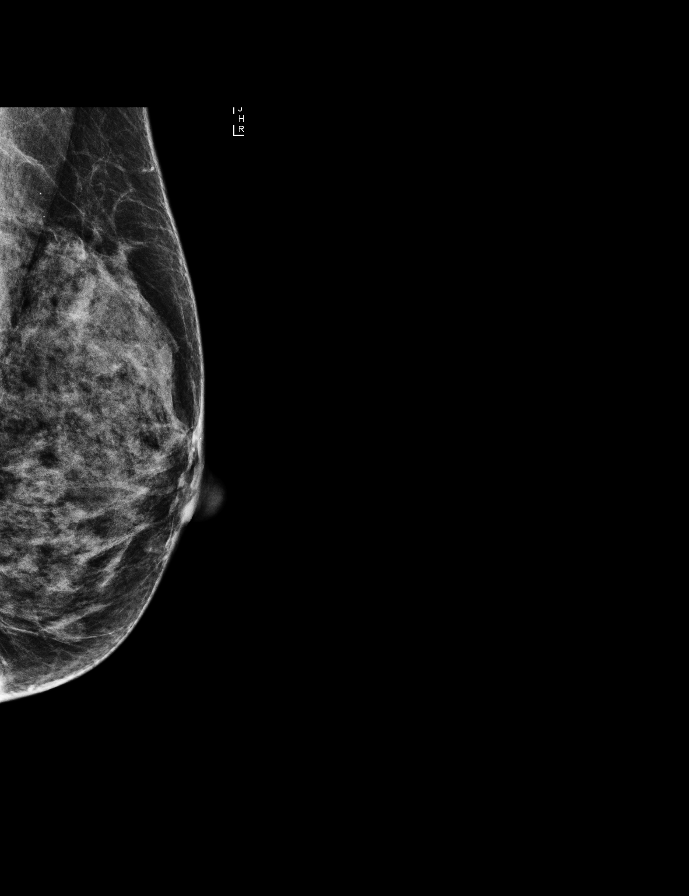

[R CC]
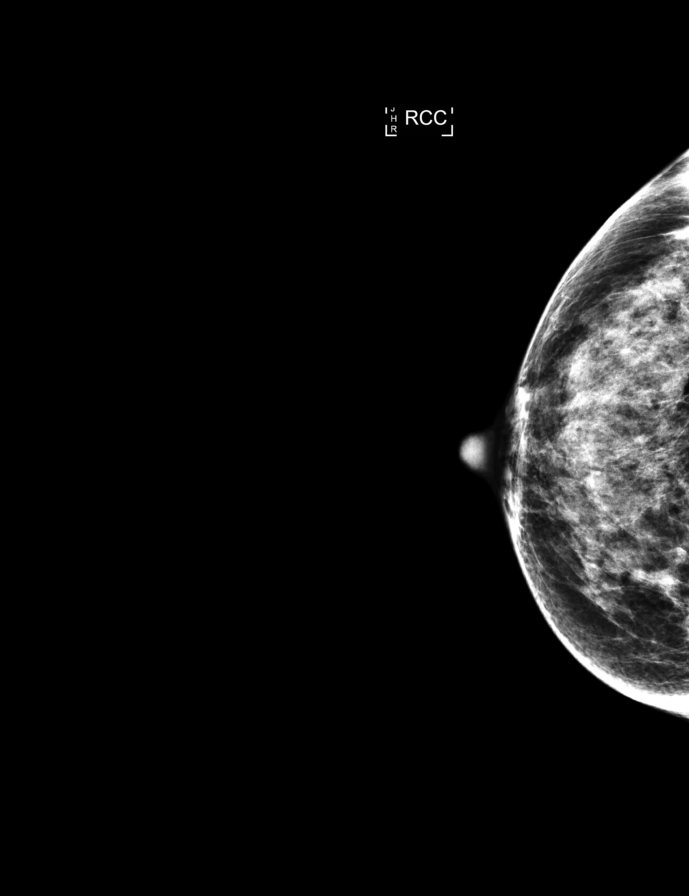

[R CC synth-2D]
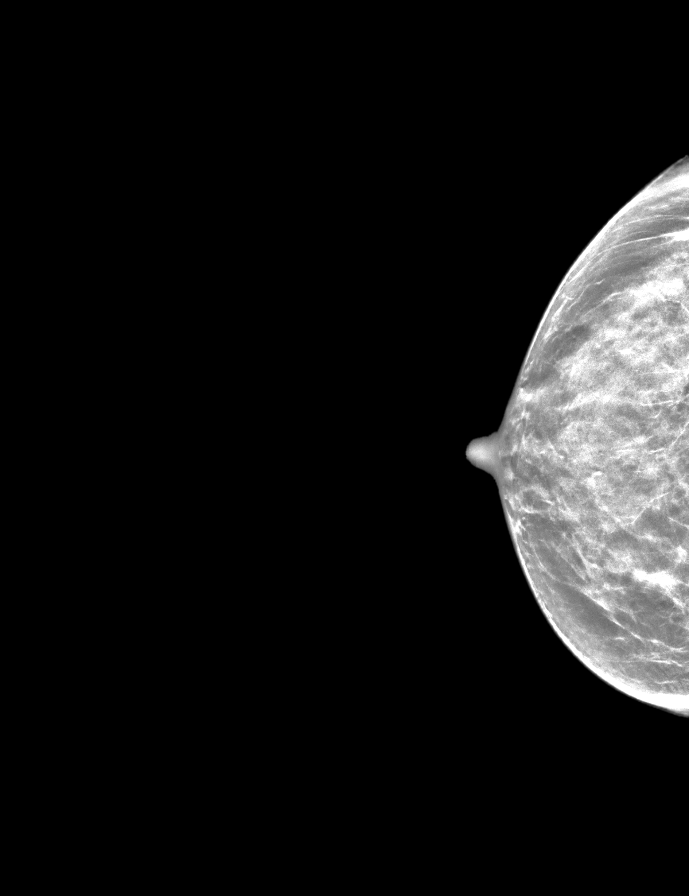

[L MLO synth-2D]
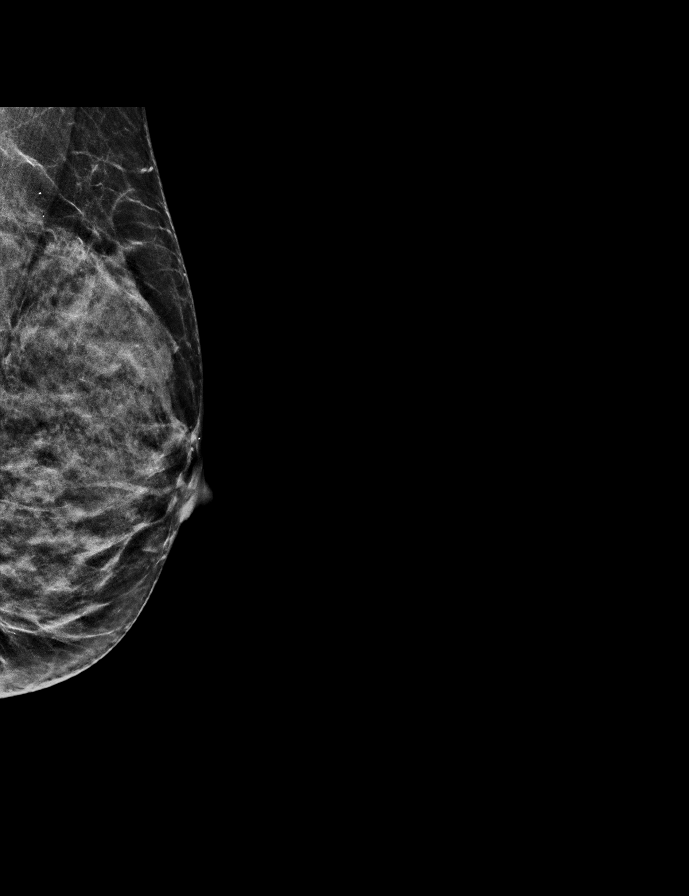

[L MLO tomo · tomo slice 23/46.0]
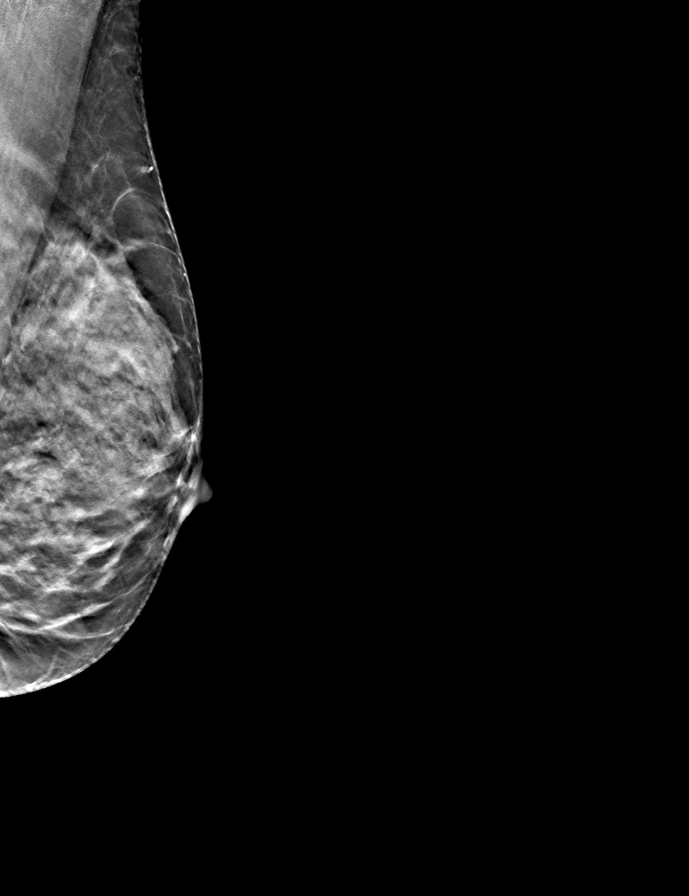

[9 of 28 positions shown; findings below may reference images not displayed]

ACR Breast Density Category d: The breast tissue is extremely dense,
which lowers the sensitivity of mammography.
FINDINGS: There are no findings suspicious for malignancy. Images were
processed with CAD.
IMPRESSION: No mammographic evidence of malignancy. A result letter of this
screening mammogram will be mailed directly to the patient.

RECOMMENDATION:
Screening mammogram in one year. (Code:US-D-RZ7)

BI-RADS CATEGORY  1: Negative.

## 2018-07-01 ENCOUNTER — Ambulatory Visit
Admission: RE | Admit: 2018-07-01 | Discharge: 2018-07-01 | Disposition: A | Discharge status: Home / Self Care | Payer: BLUE CROSS/BLUE SHIELD | Source: Ambulatory Visit | Attending: Sports Medicine | Admitting: Sports Medicine

## 2018-07-01 ENCOUNTER — Other Ambulatory Visit: Payer: Self-pay

## 2018-07-01 DIAGNOSIS — M25512 Pain in left shoulder: Secondary | ICD-10-CM

## 2018-07-01 DIAGNOSIS — M67814 Other specified disorders of tendon, left shoulder: Secondary | ICD-10-CM | POA: Diagnosis not present

## 2018-07-06 ENCOUNTER — Telehealth: Payer: Self-pay | Admitting: Sports Medicine

## 2018-07-06 DIAGNOSIS — M25512 Pain in left shoulder: Secondary | ICD-10-CM | POA: Diagnosis not present

## 2018-07-06 DIAGNOSIS — M7502 Adhesive capsulitis of left shoulder: Secondary | ICD-10-CM | POA: Diagnosis not present

## 2018-07-06 NOTE — Telephone Encounter (Signed)
  I spoke with the patient on the phone today after reviewing MRI findings of her left shoulder.  She has some mild tendinosis of the supraspinatus and some mild subacromial/subdeltoid bursitis.  Labrum and biceps tendon appear to be unremarkable.  She continues to struggle despite a recent subacromial cortisone injection.  She is unable to take oral NSAIDs.  At this point I recommend that we try formal physical therapy.  She would like to do this at [place].  If symptoms persist after 4 weeks of PT then the patient will notify me and I will refer her to Dr. Mardelle Matte.  Patient is in agreement with this plan.

## 2018-07-08 DIAGNOSIS — M25512 Pain in left shoulder: Secondary | ICD-10-CM | POA: Diagnosis not present

## 2018-07-08 DIAGNOSIS — M7502 Adhesive capsulitis of left shoulder: Secondary | ICD-10-CM | POA: Diagnosis not present

## 2018-07-12 DIAGNOSIS — M7502 Adhesive capsulitis of left shoulder: Secondary | ICD-10-CM | POA: Diagnosis not present

## 2018-07-12 DIAGNOSIS — M25512 Pain in left shoulder: Secondary | ICD-10-CM | POA: Diagnosis not present

## 2018-07-15 DIAGNOSIS — M7502 Adhesive capsulitis of left shoulder: Secondary | ICD-10-CM | POA: Diagnosis not present

## 2018-07-15 DIAGNOSIS — M25512 Pain in left shoulder: Secondary | ICD-10-CM | POA: Diagnosis not present

## 2018-07-20 DIAGNOSIS — M7502 Adhesive capsulitis of left shoulder: Secondary | ICD-10-CM | POA: Diagnosis not present

## 2018-07-20 DIAGNOSIS — M25512 Pain in left shoulder: Secondary | ICD-10-CM | POA: Diagnosis not present

## 2018-07-22 DIAGNOSIS — M25512 Pain in left shoulder: Secondary | ICD-10-CM | POA: Diagnosis not present

## 2018-07-22 DIAGNOSIS — M7502 Adhesive capsulitis of left shoulder: Secondary | ICD-10-CM | POA: Diagnosis not present

## 2018-08-09 DIAGNOSIS — M7502 Adhesive capsulitis of left shoulder: Secondary | ICD-10-CM | POA: Diagnosis not present

## 2018-08-09 DIAGNOSIS — M25512 Pain in left shoulder: Secondary | ICD-10-CM | POA: Diagnosis not present

## 2018-08-12 DIAGNOSIS — M7502 Adhesive capsulitis of left shoulder: Secondary | ICD-10-CM | POA: Diagnosis not present

## 2018-08-12 DIAGNOSIS — M25512 Pain in left shoulder: Secondary | ICD-10-CM | POA: Diagnosis not present

## 2018-10-26 DIAGNOSIS — H5203 Hypermetropia, bilateral: Secondary | ICD-10-CM | POA: Diagnosis not present

## 2018-10-26 DIAGNOSIS — H04123 Dry eye syndrome of bilateral lacrimal glands: Secondary | ICD-10-CM | POA: Diagnosis not present

## 2018-11-12 DIAGNOSIS — Z01419 Encounter for gynecological examination (general) (routine) without abnormal findings: Secondary | ICD-10-CM | POA: Diagnosis not present

## 2018-11-12 DIAGNOSIS — Z681 Body mass index (BMI) 19 or less, adult: Secondary | ICD-10-CM | POA: Diagnosis not present

## 2018-12-02 DIAGNOSIS — Z23 Encounter for immunization: Secondary | ICD-10-CM | POA: Diagnosis not present

## 2018-12-21 DIAGNOSIS — E041 Nontoxic single thyroid nodule: Secondary | ICD-10-CM | POA: Diagnosis not present

## 2018-12-21 DIAGNOSIS — Z0000 Encounter for general adult medical examination without abnormal findings: Secondary | ICD-10-CM | POA: Diagnosis not present

## 2018-12-21 DIAGNOSIS — M810 Age-related osteoporosis without current pathological fracture: Secondary | ICD-10-CM | POA: Diagnosis not present

## 2018-12-21 DIAGNOSIS — E7849 Other hyperlipidemia: Secondary | ICD-10-CM | POA: Diagnosis not present

## 2018-12-28 DIAGNOSIS — F419 Anxiety disorder, unspecified: Secondary | ICD-10-CM | POA: Diagnosis not present

## 2018-12-28 DIAGNOSIS — G4700 Insomnia, unspecified: Secondary | ICD-10-CM | POA: Diagnosis not present

## 2018-12-28 DIAGNOSIS — Z0000 Encounter for general adult medical examination without abnormal findings: Secondary | ICD-10-CM | POA: Diagnosis not present

## 2018-12-28 DIAGNOSIS — N951 Menopausal and female climacteric states: Secondary | ICD-10-CM | POA: Diagnosis not present

## 2018-12-28 DIAGNOSIS — Z1331 Encounter for screening for depression: Secondary | ICD-10-CM | POA: Diagnosis not present

## 2018-12-28 DIAGNOSIS — J309 Allergic rhinitis, unspecified: Secondary | ICD-10-CM | POA: Diagnosis not present

## 2019-01-03 ENCOUNTER — Other Ambulatory Visit: Payer: Self-pay | Admitting: Obstetrics and Gynecology

## 2019-01-03 DIAGNOSIS — Z1231 Encounter for screening mammogram for malignant neoplasm of breast: Secondary | ICD-10-CM

## 2019-01-04 DIAGNOSIS — H40023 Open angle with borderline findings, high risk, bilateral: Secondary | ICD-10-CM | POA: Diagnosis not present

## 2019-01-04 DIAGNOSIS — H40053 Ocular hypertension, bilateral: Secondary | ICD-10-CM | POA: Diagnosis not present

## 2019-01-06 DIAGNOSIS — Z23 Encounter for immunization: Secondary | ICD-10-CM | POA: Diagnosis not present

## 2019-01-18 DIAGNOSIS — Z1212 Encounter for screening for malignant neoplasm of rectum: Secondary | ICD-10-CM | POA: Diagnosis not present

## 2019-02-07 DIAGNOSIS — H40053 Ocular hypertension, bilateral: Secondary | ICD-10-CM | POA: Diagnosis not present

## 2019-02-23 ENCOUNTER — Other Ambulatory Visit: Payer: Self-pay

## 2019-02-23 ENCOUNTER — Ambulatory Visit
Admission: RE | Admit: 2019-02-23 | Discharge: 2019-02-23 | Disposition: A | Discharge status: Home / Self Care | Payer: BLUE CROSS/BLUE SHIELD | Source: Ambulatory Visit | Attending: Obstetrics and Gynecology | Admitting: Obstetrics and Gynecology

## 2019-02-23 DIAGNOSIS — Z1231 Encounter for screening mammogram for malignant neoplasm of breast: Secondary | ICD-10-CM

## 2019-06-15 DIAGNOSIS — F419 Anxiety disorder, unspecified: Secondary | ICD-10-CM | POA: Diagnosis not present

## 2019-06-15 DIAGNOSIS — R002 Palpitations: Secondary | ICD-10-CM | POA: Diagnosis not present

## 2019-06-15 DIAGNOSIS — R42 Dizziness and giddiness: Secondary | ICD-10-CM | POA: Diagnosis not present

## 2019-06-15 DIAGNOSIS — J029 Acute pharyngitis, unspecified: Secondary | ICD-10-CM | POA: Diagnosis not present

## 2019-06-15 DIAGNOSIS — H40023 Open angle with borderline findings, high risk, bilateral: Secondary | ICD-10-CM | POA: Diagnosis not present

## 2019-06-27 ENCOUNTER — Other Ambulatory Visit: Payer: Self-pay | Admitting: *Deleted

## 2019-06-27 DIAGNOSIS — R002 Palpitations: Secondary | ICD-10-CM

## 2019-07-17 ENCOUNTER — Ambulatory Visit (INDEPENDENT_AMBULATORY_CARE_PROVIDER_SITE_OTHER): Payer: BC Managed Care – PPO

## 2019-07-17 DIAGNOSIS — R002 Palpitations: Secondary | ICD-10-CM | POA: Diagnosis not present

## 2019-07-18 DIAGNOSIS — R002 Palpitations: Secondary | ICD-10-CM | POA: Diagnosis not present

## 2019-08-09 DIAGNOSIS — E041 Nontoxic single thyroid nodule: Secondary | ICD-10-CM | POA: Diagnosis not present

## 2019-10-17 DIAGNOSIS — H40013 Open angle with borderline findings, low risk, bilateral: Secondary | ICD-10-CM | POA: Diagnosis not present

## 2019-11-05 DIAGNOSIS — Z23 Encounter for immunization: Secondary | ICD-10-CM | POA: Diagnosis not present

## 2019-12-07 IMAGING — US US THYROID
1 series · 13 of 25 positions shown · non-contrast
Comparison: 09/12/2013, 10/17/2009

CLINICAL DATA: Nodule. Previous FNA biopsy of right nodule
03/23/2006.

EXAM:
THYROID ULTRASOUND
TECHNIQUE: Ultrasound examination of the thyroid gland and adjacent soft
tissues was performed.

[Series 1: us thyroid · 0.05mm/px · 13 of 48 slices shown]
[im 1/48]
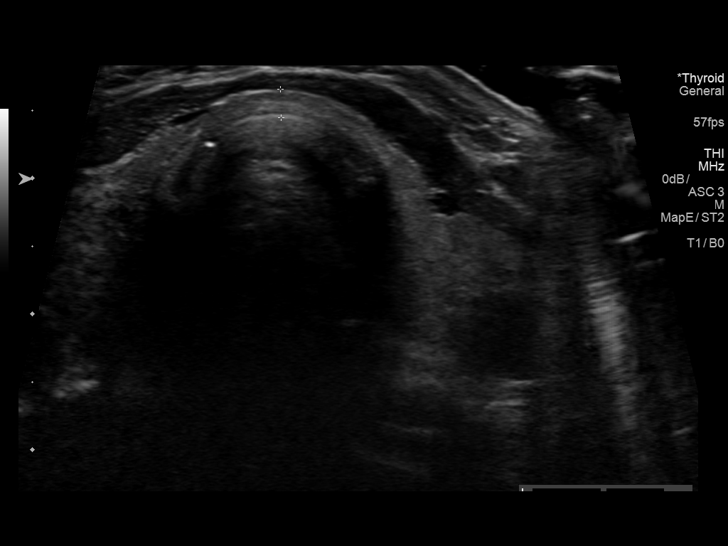
[im 4/48]
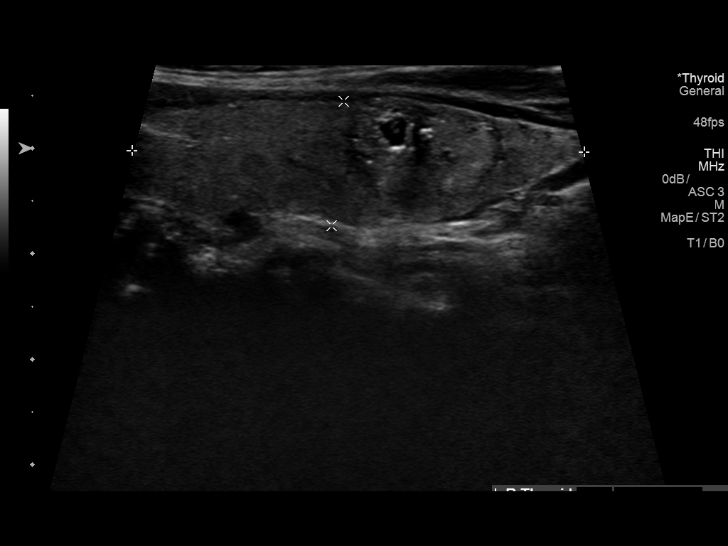
[im 8/48]
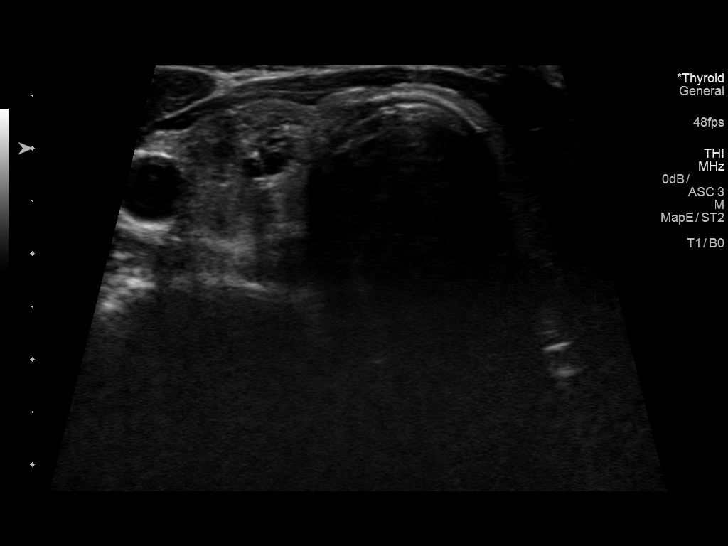
[im 12/48]
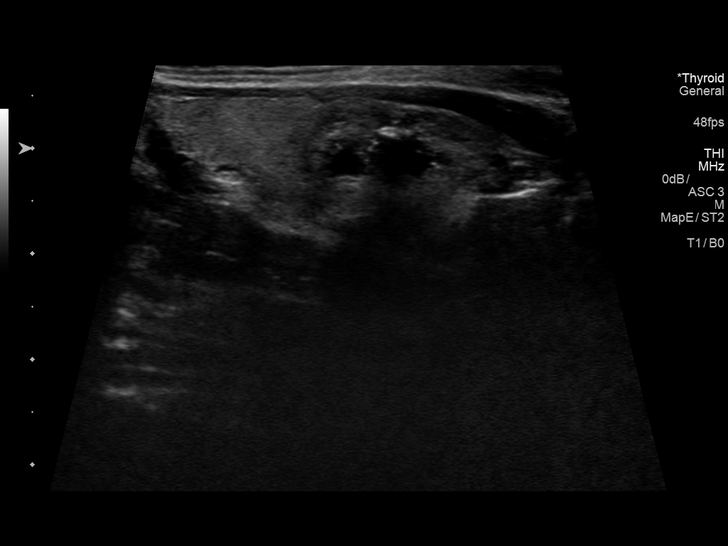
[im 16/48]
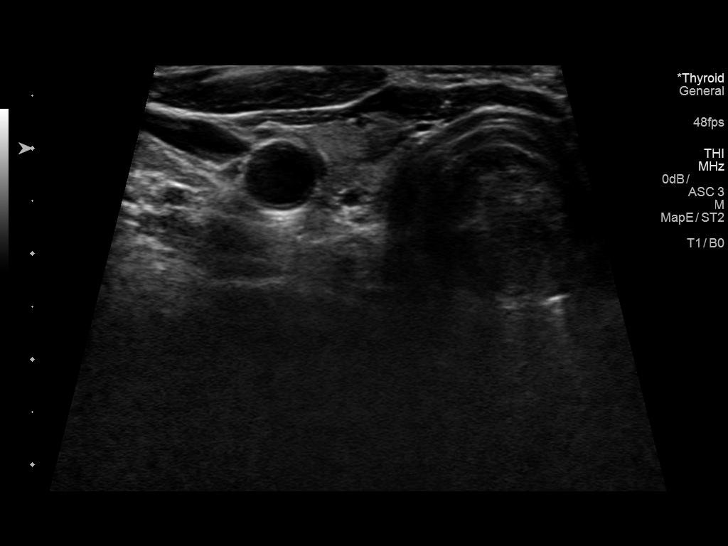
[im 20/48]
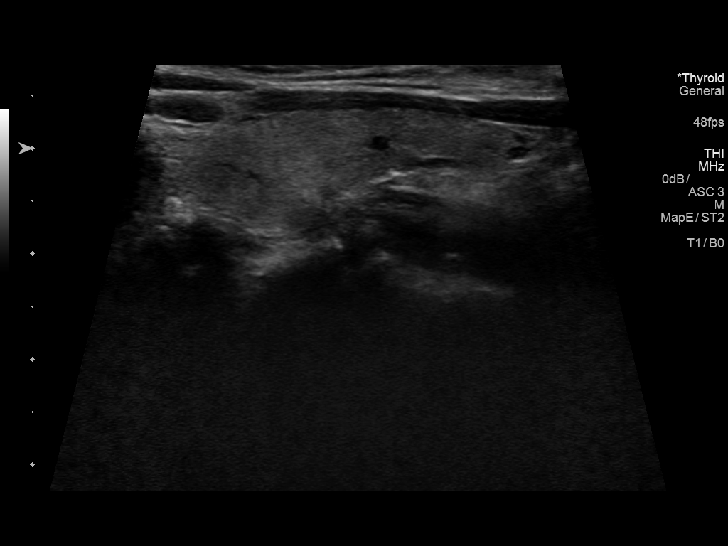
[im 24/48]
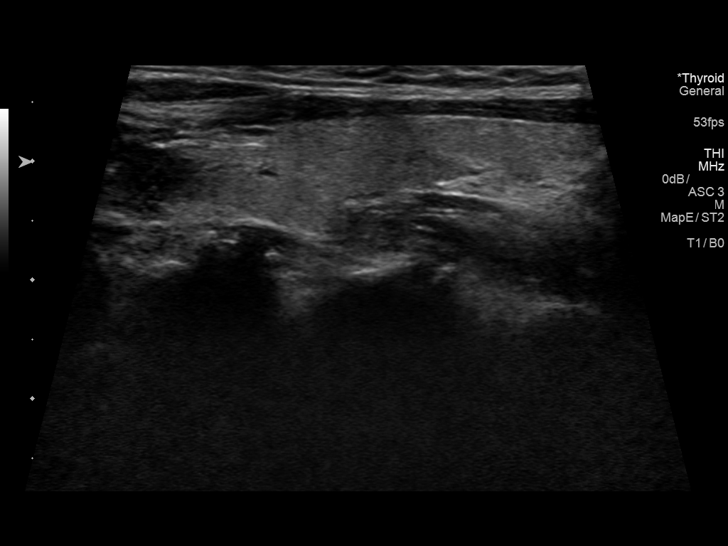
[im 28/48]
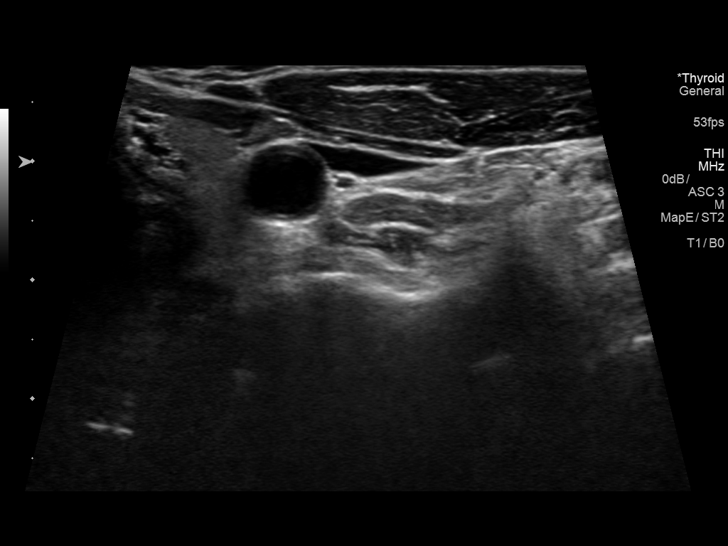
[im 32/48]
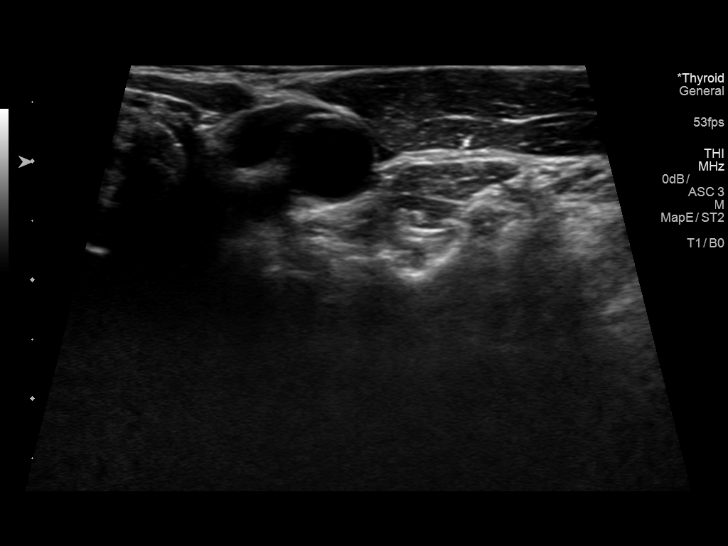
[im 36/48]
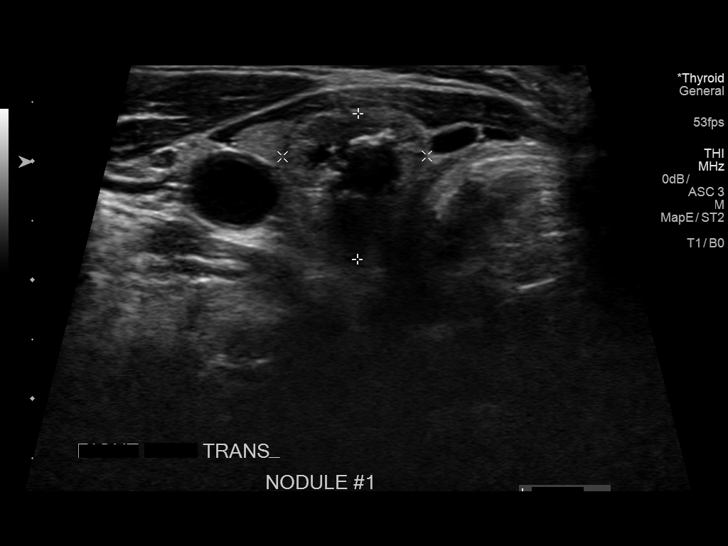
[im 40/48]
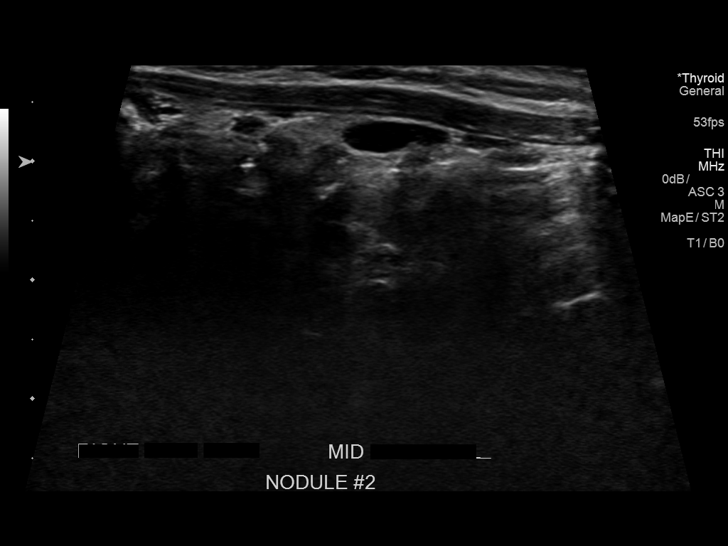
[im 44/48]
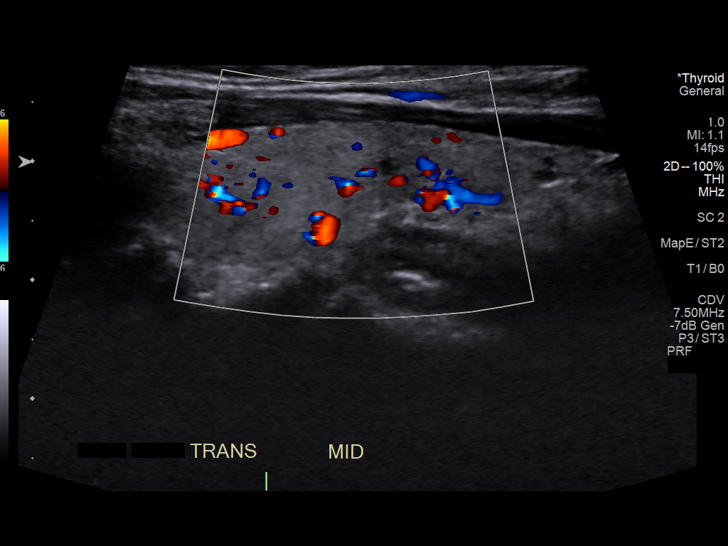
[im 48/48]
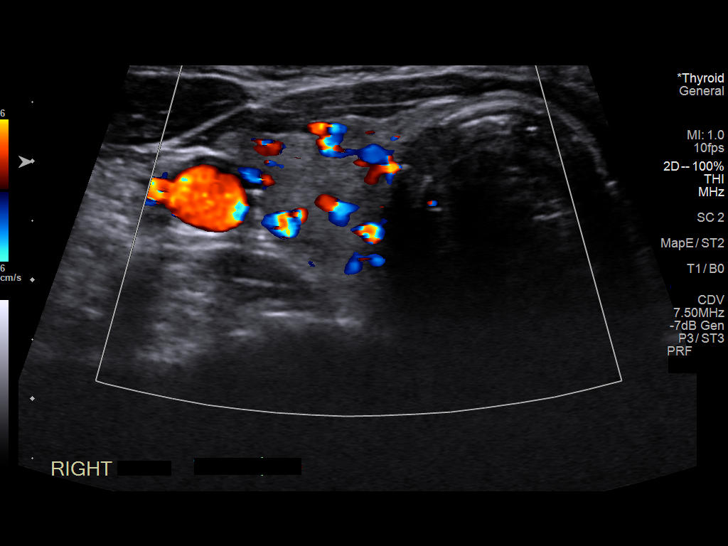

[13 of 25 positions shown; findings below may reference images not displayed]

FINDINGS: Parenchymal Echotexture: Mildly heterogenous

Isthmus: 0.2 cm thickness, previously

Right lobe: 4.3 x 1.2 x 1.3 cm, previously 4.9 x 1.4 x

Left lobe: 3.5 x 1.1 x 1 cm, previously 4.9 x 0.9 x

_________________________________________________________

Estimated total number of nodules >/= 1 cm: 1

Number of spongiform nodules >/=  2 cm not described below (TR1): 0

Number of mixed cystic and solid nodules >/= 1.5 cm not described
below (TR2): 0

_________________________________________________________

Nodule # 1: 1.9 x 1.2 x 1.2 cm mid right nodule with
macrocalcifications, previously 2.1 x 1.2 x 1.6 on 10/17/2009;
stability for greater than 5 years implies benignity. This was
previously biopsied in 8446.

0.9 cm benign colloid cyst, medial right lobe near isthmus, stable.
IMPRESSION: 1. Normal-sized thyroid with stable right nodule, previously
biopsied.

The above is in keeping with the ACR TI-RADS recommendations - [HOSPITAL] 9291;[DATE].

## 2019-12-13 DIAGNOSIS — E041 Nontoxic single thyroid nodule: Secondary | ICD-10-CM | POA: Diagnosis not present

## 2019-12-13 DIAGNOSIS — Z0000 Encounter for general adult medical examination without abnormal findings: Secondary | ICD-10-CM | POA: Diagnosis not present

## 2019-12-13 DIAGNOSIS — M810 Age-related osteoporosis without current pathological fracture: Secondary | ICD-10-CM | POA: Diagnosis not present

## 2019-12-13 DIAGNOSIS — E785 Hyperlipidemia, unspecified: Secondary | ICD-10-CM | POA: Diagnosis not present

## 2019-12-20 DIAGNOSIS — Z0000 Encounter for general adult medical examination without abnormal findings: Secondary | ICD-10-CM | POA: Diagnosis not present

## 2019-12-20 DIAGNOSIS — I493 Ventricular premature depolarization: Secondary | ICD-10-CM | POA: Diagnosis not present

## 2019-12-22 ENCOUNTER — Other Ambulatory Visit: Payer: Self-pay

## 2019-12-22 ENCOUNTER — Ambulatory Visit (INDEPENDENT_AMBULATORY_CARE_PROVIDER_SITE_OTHER): Payer: BC Managed Care – PPO | Admitting: Sports Medicine

## 2019-12-22 VITALS — BP 120/74 | Ht 65.0 in | Wt 117.0 lb

## 2019-12-22 DIAGNOSIS — M25579 Pain in unspecified ankle and joints of unspecified foot: Secondary | ICD-10-CM

## 2019-12-22 DIAGNOSIS — M542 Cervicalgia: Secondary | ICD-10-CM | POA: Diagnosis not present

## 2019-12-22 DIAGNOSIS — M25511 Pain in right shoulder: Secondary | ICD-10-CM | POA: Diagnosis not present

## 2019-12-22 DIAGNOSIS — M25512 Pain in left shoulder: Secondary | ICD-10-CM | POA: Diagnosis not present

## 2019-12-22 NOTE — Progress Notes (Signed)
Patient ID: Tasha Jackson, female   DOB: 1955/02/19, 64 y.o.   MRN: [id number]  Patient comes in today for [name] to evaluate her orthotics.  Her orthotics are about a year old and for the most part she has done very well with them.  However, a few weeks ago she began to experience some diffuse discomfort and fatigue in both feet.  She is also complaining of some pain along the Achilles tendon.  No recent injury.  Evaluation of her orthotics shows them to be in good shape but the metatarsal pads are worn down.  We replaced the metatarsal pads on both orthotics and she found them to be much more comfortable.  She is also instructed in [place] heel drop exercises for her Achilles tendinitis.  If her pain persist for another 3 to 4 weeks, she will return to the office for a complete set of new custom orthotics.

## 2019-12-27 DIAGNOSIS — M25511 Pain in right shoulder: Secondary | ICD-10-CM | POA: Diagnosis not present

## 2019-12-27 DIAGNOSIS — M25512 Pain in left shoulder: Secondary | ICD-10-CM | POA: Diagnosis not present

## 2019-12-27 DIAGNOSIS — M542 Cervicalgia: Secondary | ICD-10-CM | POA: Diagnosis not present

## 2020-01-05 DIAGNOSIS — H9319 Tinnitus, unspecified ear: Principal | ICD-10-CM

## 2020-01-05 DIAGNOSIS — M25512 Pain in left shoulder: Secondary | ICD-10-CM | POA: Diagnosis not present

## 2020-01-05 DIAGNOSIS — M25511 Pain in right shoulder: Secondary | ICD-10-CM | POA: Diagnosis not present

## 2020-01-05 DIAGNOSIS — Z6820 Body mass index (BMI) 20.0-20.9, adult: Secondary | ICD-10-CM | POA: Diagnosis not present

## 2020-01-05 DIAGNOSIS — Z01419 Encounter for gynecological examination (general) (routine) without abnormal findings: Secondary | ICD-10-CM | POA: Diagnosis not present

## 2020-01-05 DIAGNOSIS — M542 Cervicalgia: Secondary | ICD-10-CM | POA: Diagnosis not present

## 2020-01-09 DIAGNOSIS — M542 Cervicalgia: Secondary | ICD-10-CM | POA: Diagnosis not present

## 2020-01-09 DIAGNOSIS — M25511 Pain in right shoulder: Secondary | ICD-10-CM | POA: Diagnosis not present

## 2020-01-09 DIAGNOSIS — M25512 Pain in left shoulder: Secondary | ICD-10-CM | POA: Diagnosis not present

## 2020-01-24 DIAGNOSIS — M542 Cervicalgia: Secondary | ICD-10-CM | POA: Diagnosis not present

## 2020-01-24 DIAGNOSIS — M25511 Pain in right shoulder: Secondary | ICD-10-CM | POA: Diagnosis not present

## 2020-01-24 DIAGNOSIS — M25512 Pain in left shoulder: Secondary | ICD-10-CM | POA: Diagnosis not present

## 2020-01-30 ENCOUNTER — Other Ambulatory Visit: Payer: Self-pay | Admitting: Obstetrics and Gynecology

## 2020-01-30 DIAGNOSIS — Z1231 Encounter for screening mammogram for malignant neoplasm of breast: Secondary | ICD-10-CM

## 2020-02-01 DIAGNOSIS — Z03818 Encounter for observation for suspected exposure to other biological agents ruled out: Secondary | ICD-10-CM | POA: Diagnosis not present

## 2020-02-29 DIAGNOSIS — Z20822 Contact with and (suspected) exposure to covid-19: Secondary | ICD-10-CM | POA: Diagnosis not present

## 2020-03-02 ENCOUNTER — Encounter: Admit: 2020-03-02 | Discharge: 2020-03-03 | Payer: PRIVATE HEALTH INSURANCE

## 2020-03-02 DIAGNOSIS — L570 Actinic keratosis: Principal | ICD-10-CM

## 2020-03-02 DIAGNOSIS — L309 Dermatitis, unspecified: Principal | ICD-10-CM

## 2020-03-02 DIAGNOSIS — D229 Melanocytic nevi, unspecified: Principal | ICD-10-CM

## 2020-03-02 DIAGNOSIS — L820 Inflamed seborrheic keratosis: Principal | ICD-10-CM

## 2020-03-02 DIAGNOSIS — L814 Other melanin hyperpigmentation: Principal | ICD-10-CM

## 2020-03-02 DIAGNOSIS — D1801 Hemangioma of skin and subcutaneous tissue: Principal | ICD-10-CM

## 2020-03-02 MED ORDER — CLOBETASOL 0.05 % TOPICAL CREAM
OPHTHALMIC | 6 refills | 0.00000 days | Status: CP
Start: 2020-03-02 — End: ?

## 2020-03-02 MED ORDER — HALOBETASOL PROPIONATE 0.05 % TOPICAL CREAM
6 refills | 0.00000 days | Status: CP
Start: 2020-03-02 — End: ?

## 2020-03-08 ENCOUNTER — Ambulatory Visit: Payer: BC Managed Care – PPO

## 2020-03-08 DIAGNOSIS — Z20822 Contact with and (suspected) exposure to covid-19: Secondary | ICD-10-CM | POA: Diagnosis not present

## 2020-04-02 ENCOUNTER — Encounter
Admit: 2020-04-02 | Discharge: 2020-04-03 | Payer: PRIVATE HEALTH INSURANCE | Attending: Audiologist | Primary: Audiologist

## 2020-04-02 DIAGNOSIS — H903 Sensorineural hearing loss, bilateral: Secondary | ICD-10-CM | POA: Diagnosis not present

## 2020-04-02 DIAGNOSIS — H9319 Tinnitus, unspecified ear: Secondary | ICD-10-CM | POA: Diagnosis not present

## 2020-04-30 ENCOUNTER — Ambulatory Visit: Payer: BC Managed Care – PPO

## 2020-05-04 ENCOUNTER — Ambulatory Visit
Admission: RE | Admit: 2020-05-04 | Discharge: 2020-05-04 | Disposition: A | Discharge status: Home / Self Care | Payer: BC Managed Care – PPO | Source: Ambulatory Visit | Attending: Obstetrics and Gynecology | Admitting: Obstetrics and Gynecology

## 2020-05-04 ENCOUNTER — Other Ambulatory Visit: Payer: Self-pay

## 2020-05-04 DIAGNOSIS — Z1231 Encounter for screening mammogram for malignant neoplasm of breast: Secondary | ICD-10-CM | POA: Diagnosis not present

## 2020-05-07 DIAGNOSIS — H401131 Primary open-angle glaucoma, bilateral, mild stage: Secondary | ICD-10-CM | POA: Diagnosis not present

## 2020-05-07 DIAGNOSIS — H5203 Hypermetropia, bilateral: Secondary | ICD-10-CM | POA: Diagnosis not present

## 2020-05-07 DIAGNOSIS — H40003 Preglaucoma, unspecified, bilateral: Secondary | ICD-10-CM | POA: Diagnosis not present

## 2020-05-14 ENCOUNTER — Encounter
Admit: 2020-05-14 | Discharge: 2020-05-15 | Payer: PRIVATE HEALTH INSURANCE | Attending: Audiologist | Primary: Audiologist

## 2020-05-14 DIAGNOSIS — H903 Sensorineural hearing loss, bilateral: Secondary | ICD-10-CM | POA: Diagnosis not present

## 2020-05-14 DIAGNOSIS — H9319 Tinnitus, unspecified ear: Secondary | ICD-10-CM | POA: Diagnosis not present

## 2020-07-10 ENCOUNTER — Ambulatory Visit: Admit: 2020-07-10 | Discharge: 2020-07-11 | Attending: Audiologist | Primary: Audiologist

## 2020-07-23 ENCOUNTER — Institutional Professional Consult (permissible substitution)
Admit: 2020-07-23 | Discharge: 2020-07-24 | Payer: PRIVATE HEALTH INSURANCE | Attending: Audiologist | Primary: Audiologist

## 2020-08-10 ENCOUNTER — Encounter
Admit: 2020-08-10 | Discharge: 2020-08-11 | Payer: PRIVATE HEALTH INSURANCE | Attending: Audiologist | Primary: Audiologist

## 2020-08-30 DIAGNOSIS — M25511 Pain in right shoulder: Secondary | ICD-10-CM | POA: Diagnosis not present

## 2020-08-30 DIAGNOSIS — M542 Cervicalgia: Secondary | ICD-10-CM | POA: Diagnosis not present

## 2020-08-30 DIAGNOSIS — M25512 Pain in left shoulder: Secondary | ICD-10-CM | POA: Diagnosis not present

## 2020-09-06 DIAGNOSIS — M542 Cervicalgia: Secondary | ICD-10-CM | POA: Diagnosis not present

## 2020-09-06 DIAGNOSIS — M25511 Pain in right shoulder: Secondary | ICD-10-CM | POA: Diagnosis not present

## 2020-09-06 DIAGNOSIS — M25512 Pain in left shoulder: Secondary | ICD-10-CM | POA: Diagnosis not present

## 2020-09-17 DIAGNOSIS — M25512 Pain in left shoulder: Secondary | ICD-10-CM | POA: Diagnosis not present

## 2020-09-17 DIAGNOSIS — M25511 Pain in right shoulder: Secondary | ICD-10-CM | POA: Diagnosis not present

## 2020-09-17 DIAGNOSIS — M542 Cervicalgia: Secondary | ICD-10-CM | POA: Diagnosis not present

## 2020-09-18 ENCOUNTER — Telehealth
Admit: 2020-09-18 | Discharge: 2020-09-19 | Payer: PRIVATE HEALTH INSURANCE | Attending: Audiologist | Primary: Audiologist

## 2020-09-19 DIAGNOSIS — M25511 Pain in right shoulder: Secondary | ICD-10-CM | POA: Diagnosis not present

## 2020-09-19 DIAGNOSIS — M542 Cervicalgia: Secondary | ICD-10-CM | POA: Diagnosis not present

## 2020-09-19 DIAGNOSIS — M25512 Pain in left shoulder: Secondary | ICD-10-CM | POA: Diagnosis not present

## 2020-09-28 ENCOUNTER — Ambulatory Visit
Admit: 2020-09-28 | Discharge: 2020-09-29 | Payer: PRIVATE HEALTH INSURANCE | Attending: Audiologist | Primary: Audiologist

## 2020-10-23 ENCOUNTER — Encounter: Payer: Self-pay | Admitting: Sports Medicine

## 2020-10-23 ENCOUNTER — Ambulatory Visit (INDEPENDENT_AMBULATORY_CARE_PROVIDER_SITE_OTHER): Payer: BC Managed Care – PPO | Admitting: Sports Medicine

## 2020-10-23 ENCOUNTER — Other Ambulatory Visit: Payer: Self-pay

## 2020-10-23 DIAGNOSIS — M84375A Stress fracture, left foot, initial encounter for fracture: Secondary | ICD-10-CM

## 2020-10-23 NOTE — Assessment & Plan Note (Signed)
US shows fluid cap near 2nd cuneiform concerning for stress reaction.  Will have patient avoid high impact exercises for next 3 weeks, patient counseled on avoiding jumping and tennis to allow for healing, ok to continue upper body weight lifting, swimming or biking, patient voices understanding  Patient will bring orthotics to follow up appointment for evaluation and consideration of new orthotics given her current ones are 63 years old

## 2020-10-23 NOTE — Progress Notes (Signed)
   PCP: Velna Hatchet, MD  Subjective:   HPI: Patient is a 65 y.o. female here for left foot pain.  Patient reports walking barefoot at [place] last month. She states that after this trip she began to have pain in the left dorsal aspect of her foot near her distal second metatarsal joint. Now she has noticed pain when wearing her most comfortable shoes.  Patient reports that she sometimes experiences throbbing on the dorsal aspect of her left foot during the night.  She also reports swelling in the left foot as compared to the right.  Her pain is worse with walking.  She does report some paresthesias in her fourth and fifth toes but states that this is rare.  She reports wearing orthotics in her tennis shoes that are 63 years old.  Patient plays tennis and participates in a aerobic class that does include a lot of jumping.  She has been using topical Voltaren gel for pain relief when it is severe.       Objective:  Physical Exam:  Gen: awake, alert, NAD, comfortable in exam room Pulm: breathing unlabored  Foot: Inspection:  No obvious bony deformity.  + swelling on dorsal left foot, no erythema, nor bruising.  Pes cavus  Palpation: + tenderness to palpation ROM: Full ROM of the ankle. Normal midfoot flexibility Strength: 5/5 strength ankle in all planes Neurovascular: N/V intact distally in the lower extremity, palpable dorsalis pedis and posterior tibialis  Special tests: Negative anterior drawer. Negative squeeze. normal midfoot flexibility.  [name]: Fluid cap adjacent to second metatarsal joint    Assessment & Plan:    Stress reaction of left foot US shows fluid cap near 2nd cuneiform concerning for stress reaction.  Will have patient avoid high impact exercises for next 3 weeks, patient counseled on avoiding jumping and tennis to allow for healing, ok to continue upper body weight lifting, swimming or biking, patient voices understanding  Patient will bring orthotics to  follow up appointment for evaluation and consideration of new orthotics given her current ones are four years old   Eulis Foster, MD [place], PGY-3 10/23/2020 10:19 AM  Patient seen and evaluated with the resident.  I agree with the above plan of care.  Point-of-care ultrasound today shows a fluid Adjacent to the second metatarsal.  This suggests a stress injury to the second metatarsal.  Patient is not limping so I do not think we need to immobilize.  Instead, she will avoid any sort of impact exercise such as tennis or aerobics until follow-up with me in 3 weeks.  We will plan on repeating her ultrasound at that time.  She will also bring in her old orthotics and we will consider making a new pair since they are 63 years old.

## 2020-11-13 ENCOUNTER — Ambulatory Visit (INDEPENDENT_AMBULATORY_CARE_PROVIDER_SITE_OTHER): Payer: BC Managed Care – PPO | Admitting: Sports Medicine

## 2020-11-13 VITALS — Ht 65.0 in | Wt 115.0 lb

## 2020-11-13 DIAGNOSIS — M84375A Stress fracture, left foot, initial encounter for fracture: Secondary | ICD-10-CM

## 2020-11-13 NOTE — Progress Notes (Signed)
   Subjective:    Patient ID: Tasha Jackson, female    DOB: 11-Dec-1955, 65 y.o.   MRN: [id number]  HPI   Saleha presents today for follow-up on a left foot second metatarsal stress reaction.  She is doing well.  She denies pain today.  She has not been playing tennis since her last office visit with me 3 weeks ago.  She did bring her orthotics with her today.  They seem to be in pretty good shape but she would be interested in getting a new pair.    Review of Systems As above    Objective:   Physical Exam  Well-developed, well-nourished.  No acute distress  Left foot: Full ankle range of motion.  There is no swelling across the dorsum of the foot.  Minimal tenderness to palpation at the proximal second metatarsal but a negative metatarsal squeeze.  Cavus foot with standing.  Good pulses.  Walking without a limp.  Limited MSK ultrasound of the dorsal left foot concentrating over the second metatarsal shows resolution of the fluid seen previously adjacent to the proximal second metatarsal.      Assessment & Plan:   Improved left foot pain secondary to metatarsal stress reaction Cavus foot  Patient will resume tennis tomorrow.  I recommended that she wait a week or 2 before resuming other cardio workouts.  She will return to the office in the next few days for new custom orthotics.  Her current orthotics have metatarsal pads so we will make sure that we fit her new orthotics with those as well.  She is also interested in trying a full ankle body helix compression sleeve.  I think this will give her some additional arch support.  This note was dictated using Dragon naturally speaking software and may contain errors in syntax, spelling, or content which have not been identified prior to signing this note.

## 2020-11-19 ENCOUNTER — Ambulatory Visit (INDEPENDENT_AMBULATORY_CARE_PROVIDER_SITE_OTHER): Payer: BC Managed Care – PPO | Admitting: Sports Medicine

## 2020-11-19 VITALS — BP 112/70 | Ht 64.5 in | Wt 115.0 lb

## 2020-11-19 DIAGNOSIS — Q667 Congenital pes cavus, unspecified foot: Secondary | ICD-10-CM | POA: Diagnosis not present

## 2020-11-19 NOTE — Progress Notes (Signed)
Patient ID: Tasha Jackson, female   DOB: 06/16/1955, 65 y.o.   MRN: [id number]  Orma presents today for custom orthotics.  Please see the office notes from 10/23/2020 and 11/13/2020 for details regarding history and physical exam findings regarding a recent metatarsal stress reaction in her left foot.  She has her old custom orthotics with her today but forgot to bring her tennis shoes.  New custom orthotics were created as below.  If we need to adjust them, she will return to the office.  Otherwise she will follow-up as needed.   Patient was fitted for a : standard, cushioned, semi-rigid orthotic. The orthotic was heated and afterward the patient stood on the orthotic blank positioned on the orthotic stand. The patient was positioned in subtalar neutral position and 10 degrees of ankle dorsiflexion in a weight bearing stance. After completion of molding, a stable base was applied to the orthotic blank. The blank was ground to a stable position for weight bearing. Size: 7 Base: Blue EVA Posting: none Additional orthotic padding: B/L metatarsal pads

## 2020-11-23 DIAGNOSIS — J0100 Acute maxillary sinusitis, unspecified: Secondary | ICD-10-CM | POA: Diagnosis not present

## 2020-12-13 DIAGNOSIS — E041 Nontoxic single thyroid nodule: Secondary | ICD-10-CM | POA: Diagnosis not present

## 2020-12-13 DIAGNOSIS — E785 Hyperlipidemia, unspecified: Secondary | ICD-10-CM | POA: Diagnosis not present

## 2020-12-13 DIAGNOSIS — M810 Age-related osteoporosis without current pathological fracture: Secondary | ICD-10-CM | POA: Diagnosis not present

## 2020-12-18 DIAGNOSIS — Z1339 Encounter for screening examination for other mental health and behavioral disorders: Secondary | ICD-10-CM | POA: Diagnosis not present

## 2020-12-18 DIAGNOSIS — M810 Age-related osteoporosis without current pathological fracture: Secondary | ICD-10-CM | POA: Diagnosis not present

## 2020-12-18 DIAGNOSIS — Z23 Encounter for immunization: Secondary | ICD-10-CM | POA: Diagnosis not present

## 2020-12-18 DIAGNOSIS — Z1331 Encounter for screening for depression: Secondary | ICD-10-CM | POA: Diagnosis not present

## 2021-01-03 DIAGNOSIS — Z1331 Encounter for screening for depression: Secondary | ICD-10-CM | POA: Diagnosis not present

## 2021-01-03 DIAGNOSIS — E039 Hypothyroidism, unspecified: Secondary | ICD-10-CM | POA: Diagnosis not present

## 2021-01-03 DIAGNOSIS — Z0000 Encounter for general adult medical examination without abnormal findings: Secondary | ICD-10-CM | POA: Diagnosis not present

## 2021-02-07 DIAGNOSIS — M810 Age-related osteoporosis without current pathological fracture: Secondary | ICD-10-CM | POA: Diagnosis not present

## 2021-02-14 ENCOUNTER — Encounter: Payer: Self-pay | Admitting: Gastroenterology

## 2021-03-06 DIAGNOSIS — R197 Diarrhea, unspecified: Secondary | ICD-10-CM | POA: Diagnosis not present

## 2021-03-06 DIAGNOSIS — U071 COVID-19: Secondary | ICD-10-CM | POA: Diagnosis not present

## 2021-03-13 ENCOUNTER — Encounter: Payer: Self-pay | Admitting: Physician Assistant

## 2021-03-14 ENCOUNTER — Telehealth: Payer: Self-pay | Admitting: Physician Assistant

## 2021-03-14 NOTE — Telephone Encounter (Signed)
ERROR

## 2021-03-25 DIAGNOSIS — H40023 Open angle with borderline findings, high risk, bilateral: Secondary | ICD-10-CM | POA: Diagnosis not present

## 2021-03-25 NOTE — Progress Notes (Signed)
03/27/2021 Tasha Jackson [id number] 05-03-1955   ASSESSMENT AND PLAN:   Irritable bowel syndrome with diarrhea If tests are negative, can consider trial of Xifaxin which patient is interested in Has been on viberzi but takes inconsistently.  Screen for colon cancer We have discussed the risks of bleeding, infection, perforation, medication reactions, a 10-20% miss rate for small colon cancer or polyp and remote risk of death associated with colonoscopy. All questions were answered and the patient acknowledges these risk and wishes to proceed.  Diarrhea of presumed infectious origin -     Pancreatic elastase, fecal; Future -     Clostridium difficile Toxin B, Qualitative, Real-Time PCR; Future -     GI Profile, Stool, PCR; Future   Patient Care Team: Tasha Hatchet, MD as PCP - General (Internal Medicine)  HISTORY OF PRESENT ILLNESS: 66 y.o. female referred by Tasha Hatchet, MD, with a past medical history of hyperlipidemia and others listed below presents for evaluation of IBS and colon recall.  Previous patient of Tasha Jackson 12/11/2010 colonoscopy showed no polyps, biopsy negative for microscopic colitis- patient did not drink all of the prep last time.  12/11/2010  Endoscopy showed mild gastritis, normal esophagus, small bowel biopsy negative for celiac  She has had stomach issues since she was younger.  She avoids lactose which helps.  States very much linked with her emotions, can be happy excited and will "set off her stomach" or stress/sadness.  She had COVID in Jan and her first symptoms was diarrhea, and she continues to have worsening diarrhea. Everything she eats goes right through her.  Having diarrhea 3-4 x a day, small volume, with AB cramping, worse in the AM and after lunch.   Was having nocturnal symptoms previously. She has AB bloating.  She thinks she has hemorrhoid on Saturday, has some rectal discomfort with wiping and BRB on TP only.  Has lost 6  lbs since Jan. Patient denies fever, chills. Denies close contacts ill, recent camping, recent travel. Patient denies recent antibiotic use.  Has some GERD and nausea, worse with fresh fruit and veggies.  Patient denies dysphagia, vomiting, melena.  No family history of GI malignancy.   Current Medications:   Current Outpatient Medications (Endocrine & Metabolic):    NP THYROID 60 MG tablet, Take 60 mg by mouth daily.   Current Outpatient Medications (Respiratory):    azelastine (ASTELIN) 137 MCG/SPRAY nasal spray, TWO SPRAYS IN EACH NOSTRIL TWICE DAILY   cetirizine (ZYRTEC) 10 MG tablet, Take 10 mg by mouth as needed.    Current Outpatient Medications (Analgesics):    CELEBREX 200 MG capsule, TAKE ONE CAPSULE EACH DAY   Current Outpatient Medications (Other):    ALPRAZolam (XANAX) 0.5 MG tablet, TAKE ONE OR TWO TABLETS TWICE DAILY AS NEEDED FOR ANXIETY OR SLEEP   Ascorbic Acid (VITAMIN C) 1000 MG tablet, Take 1,000 mg by mouth daily.     Cholecalciferol (VITAMIN D3) 1000 UNITS CAPS, Take by mouth.     clobetasol cream (TEMOVATE) 0.05 %, Apply twice a day to affected areas of body.  Avoid face and folds   diclofenac sodium (VOLTAREN) 1 % GEL, Apply 2 g topically 2 (two) times daily as needed.   Eluxadoline (VIBERZI) 100 MG TABS, Take 1 tablet by mouth 2 (two) times daily.   Halcinonide 0.1 % CREA, Use on hands and arms as needed   halobetasol (ULTRAVATE) 0.05 % cream, Apply twice a day to affected areas of body.Marland Kitchenavoid face and  folds   latanoprost (XALATAN) 0.005 % ophthalmic solution, Place 1 drop into both eyes at bedtime.   ondansetron (ZOFRAN) 4 MG tablet, Take 4 mg by mouth every 6 (six) hours as needed.  Medical History:  Past Medical History:  Diagnosis Date   Allergy    TAKE 3 PILLS ALL YEAR ROUND   Anxiety    Asthma    during exercise   HSV (herpes simplex virus) infection    Hypothyroidism    IBS (irritable bowel syndrome)    IBS (irritable bowel syndrome)     Lactose intolerance    Osteopenia    PVC (premature ventricular contraction)    Allergies:  Allergies  Allergen Reactions   Amoxicillin-Pot Clavulanate     REACTION: GI upset   Fluticasone     Other reaction(s): increased eye pressures   Lactose Intolerance (Gi)    Other Itching    Certain Chemicals cause contact dermatitis-itching, redness and skin peels of hands    Prednisone     Other reaction(s): increased intraocular pressure   Sulfa Antibiotics     Other reaction(s): Unknown     Surgical History:  She  has a past surgical history that includes Elbow surgery (Right, 2007). Family History:  Her family history includes Breast cancer in her maternal grandmother; Heart disease in her father; Hypertension in her father; Melanoma in her mother. Social History:   reports that she quit smoking about 38 years ago. Her smoking use included cigarettes. She has never used smokeless tobacco. She reports current alcohol use. She reports that she does not use drugs.  REVIEW OF SYSTEMS  : All other systems reviewed and negative except where noted in the History of Present Illness.   PHYSICAL EXAM: BP 124/76 (BP Location: Left Arm, Patient Position: Sitting, Cuff Size: Normal)    Pulse 84    Ht 5\' 4"  (1.626 m) Comment: height measured without shoes   Wt 114 lb 8 oz (51.9 kg)    LMP 12/10/2010    BMI 19.65 kg/m  General:   Pleasant, well developed female in no acute distress Head:  Normocephalic and atraumatic. Eyes: sclerae anicteric,conjunctive pink  Heart:  regular rate and rhythm Pulm: Clear anteriorly; no wheezing Abdomen:  Soft, Flat AB, skin exam normal, Normal bowel sounds.  no  tenderness . Without guarding and Without rebound, without hepatomegaly. Extremities:  Without edema. Msk:  Symmetrical without gross deformities. Peripheral pulses intact.  Neurologic:  Alert and  oriented x4;  grossly normal neurologically. Skin:   Dry and intact without significant lesions or  rashes. Psychiatric: Demonstrates good judgement and reason without abnormal affect or behaviors.   Tasha Crofts, PA-C 9:08 AM

## 2021-03-27 ENCOUNTER — Ambulatory Visit: Payer: BC Managed Care – PPO | Admitting: Physician Assistant

## 2021-03-27 ENCOUNTER — Other Ambulatory Visit: Payer: BC Managed Care – PPO

## 2021-03-27 ENCOUNTER — Encounter: Payer: Self-pay | Admitting: Physician Assistant

## 2021-03-27 ENCOUNTER — Ambulatory Visit (INDEPENDENT_AMBULATORY_CARE_PROVIDER_SITE_OTHER): Payer: BC Managed Care – PPO | Admitting: Physician Assistant

## 2021-03-27 VITALS — BP 124/76 | HR 84 | Ht 64.0 in | Wt 114.5 lb

## 2021-03-27 DIAGNOSIS — Z1211 Encounter for screening for malignant neoplasm of colon: Secondary | ICD-10-CM

## 2021-03-27 DIAGNOSIS — R197 Diarrhea, unspecified: Secondary | ICD-10-CM | POA: Diagnosis not present

## 2021-03-27 DIAGNOSIS — K580 Irritable bowel syndrome with diarrhea: Secondary | ICD-10-CM | POA: Diagnosis not present

## 2021-03-27 MED ORDER — CLENPIQ 10-3.5-12 MG-GM -GM/160ML PO SOLN: 320.0000 mL | Freq: Once | ORAL | 0 refills | Status: AC

## 2021-03-27 NOTE — Patient Instructions (Addendum)
If you are age 64 or older, your body mass index should be between 23-30. Your Body mass index is 19.65 kg/m. If this is out of the aforementioned range listed, please consider follow up with your Primary Care Provider.  If you are age 15 or younger, your body mass index should be between 19-25. Your Body mass index is 19.65 kg/m. If this is out of the aformentioned range listed, please consider follow up with your Primary Care Provider.   The [place] [place] [facility] providers would like to encourage you to use [facility] to communicate with providers for non-urgent requests or questions.  Due to long hold times on the telephone, sending your provider a message by [facility] may be a faster and more efficient way to get a response.  Please allow 48 business hours for a response.  Please remember that this is for non-urgent requests.   You have been scheduled for a colonoscopy. Please follow written instructions given to you at your visit today.  Please pick up your prep supplies at the pharmacy within the next 1-3 days. If you use inhalers (even only as needed), please bring them with you on the day of your procedure.    Please try low FODMAP diet Add fiber like benefiber or citracel once a day Increase activity Can do trial of IBGard for AB pain- Take 1-2 capsules once a day for maintence or twice a day during a flare Please try to decrease stress. if any worsening symptoms like blood in stool, weight loss, please call the office or go to the ER.   If tests are negative, can consider trial of Xifaxin.    Irritable Bowel Syndrome, Adult Irritable bowel syndrome (IBS) is a group of symptoms that affects the organs responsible for digestion (gastrointestinal or GI tract). IBS is not one specific disease. To regulate how the GI tract works, the body sends signals back and forth between the intestines and the brain. If you have IBS, there may be a problem with these signals. As a result, the GI tract does not  function normally. The intestines may become more sensitive and overreact to certain things. This may be especially true when you eat certain foods or when you are under stress. There are four types of IBS. These may be determined based on the consistency of your stool (feces): IBS with diarrhea. IBS with constipation. Mixed IBS. Unsubtyped IBS. It is important to know which type of IBS you have. Certain treatments are more likely to be helpful for certain types of IBS. What are the causes? The exact cause of IBS is not known. What increases the risk? You may have a higher risk for IBS if you: Are female. Are younger than 55. Have a family history of IBS. Have a mental health condition, such as depression, anxiety, or post-traumatic stress disorder. Have had a bacterial infection of your GI tract. What are the signs or symptoms? Symptoms of IBS vary from person to person. The main symptom is abdominal pain or discomfort. Other symptoms usually include one or more of the following: Diarrhea, constipation, or both. Abdominal swelling or bloating. Feeling full after eating a small or regular-sized meal. Frequent gas. Mucus in the stool. A feeling of having more stool left after a bowel movement. Symptoms tend to come and go. They may be triggered by stress, mental health conditions, or certain foods. How is this diagnosed? This condition may be diagnosed based on a physical exam, your medical history, and your symptoms.  You may have tests, such as: Blood tests. Stool test. X-rays. CT scan. Colonoscopy. This is a procedure in which your GI tract is viewed with a long, thin, flexible tube. How is this treated? There is no cure for IBS, but treatment can help relieve symptoms. Treatment depends on the type of IBS you have, and may include: Changes to your diet, such as: Avoiding foods that cause symptoms. Drinking more water. Following a low-FODMAP (fermentable oligosaccharides,  disaccharides, monosaccharides, and polyols) diet for up to 6 weeks, or as told by your health care provider. FODMAPs are sugars that are hard for some people to digest. Eating more fiber. Eating medium-sized meals at the same times every day. Medicines. These may include: Fiber supplements, if you have constipation. Medicine to control diarrhea (antidiarrheal medicines). Medicine to help control muscle tightening (spasms) in your GI tract (antispasmodic medicines). Medicines to help with mental health conditions, such as antidepressants or tranquilizers. Talk therapy or counseling. Working with a diet and nutrition specialist (dietitian) to help create a food plan that is right for you. Managing your stress. Follow these instructions at home: Eating and drinking Eat a healthy diet. Eat medium-sized meals at about the same time every day. Do not eat large meals. Gradually eat more fiber-rich foods. These include whole grains, fruits, and vegetables. This may be especially helpful if you have IBS with constipation. Eat a diet low in FODMAPs. Drink enough fluid to keep your urine pale yellow. Keep a journal of foods that seem to trigger symptoms. Avoid foods and drinks that: Contain added sugar. Make your symptoms worse. Dairy products, caffeinated drinks, and carbonated drinks can make symptoms worse for some people. General instructions Take over-the-counter and prescription medicines and supplements only as told by your health care provider. Get enough exercise. Do at least 150 minutes of moderate-intensity exercise each week. Manage your stress. Getting enough sleep and exercise can help you manage stress. Keep all follow-up visits as told by your health care provider and therapist. This is important. Alcohol Use Do not drink alcohol if: Your health care provider tells you not to drink. You are pregnant, may be pregnant, or are planning to become pregnant. If you drink alcohol, limit  how much you have: 0-1 drink a day for women. 0-2 drinks a day for men. Be aware of how much alcohol is in your drink. In the [place] one drink equals one typical bottle of beer (12 oz), one-half glass of wine (5 oz), or one shot of hard liquor (1 oz). Contact a health care provider if you have: Constant pain. Weight loss. Difficulty or pain when swallowing. Diarrhea that gets worse. Get help right away if you have: Severe abdominal pain. Fever. Diarrhea with symptoms of dehydration, such as dizziness or dry mouth. Bright red blood in your stool. Stool that is black and tarry. Abdominal swelling. Vomiting that does not stop. Blood in your vomit. Summary Irritable bowel syndrome (IBS) is not one specific disease. It is a group of symptoms that affects digestion. Your intestines may become more sensitive and overreact to certain things. This may be especially true when you eat certain foods or when you are under stress. There is no cure for IBS, but treatment can help relieve symptoms. This information is not intended to replace advice given to you by your health care provider. Make sure you discuss any questions you have with your health care provider. Document Revised: 09/22/2019 Document Reviewed: 09/22/2019 Elsevier Patient Education  2022 [employer].  It was a pleasure to see you today!  Thank you for trusting me with your gastrointestinal care!    Vicie Mutters, PA-C

## 2021-03-27 NOTE — Progress Notes (Signed)
Agree with the assessment and plan as outlined by Amanda Collier, PA-C. ? ?Mehdi Gironda, DO, FACG ? ?

## 2021-03-28 ENCOUNTER — Other Ambulatory Visit: Payer: BC Managed Care – PPO

## 2021-03-28 DIAGNOSIS — R197 Diarrhea, unspecified: Secondary | ICD-10-CM | POA: Diagnosis not present

## 2021-03-30 LAB — GI PROFILE, STOOL, PCR

## 2021-03-31 DIAGNOSIS — R140 Abdominal distension (gaseous): Secondary | ICD-10-CM | POA: Diagnosis not present

## 2021-03-31 DIAGNOSIS — K580 Irritable bowel syndrome with diarrhea: Secondary | ICD-10-CM | POA: Diagnosis not present

## 2021-04-01 ENCOUNTER — Encounter: Payer: Self-pay | Admitting: Gastroenterology

## 2021-04-01 ENCOUNTER — Ambulatory Visit
Admit: 2021-04-01 | Discharge: 2021-04-02 | Payer: PRIVATE HEALTH INSURANCE | Attending: Student in an Organized Health Care Education/Training Program | Primary: Student in an Organized Health Care Education/Training Program

## 2021-04-01 DIAGNOSIS — L814 Other melanin hyperpigmentation: Principal | ICD-10-CM

## 2021-04-01 DIAGNOSIS — L309 Dermatitis, unspecified: Principal | ICD-10-CM

## 2021-04-01 DIAGNOSIS — L821 Other seborrheic keratosis: Principal | ICD-10-CM

## 2021-04-01 DIAGNOSIS — D229 Melanocytic nevi, unspecified: Principal | ICD-10-CM

## 2021-04-01 DIAGNOSIS — L820 Inflamed seborrheic keratosis: Principal | ICD-10-CM

## 2021-04-01 DIAGNOSIS — D1801 Hemangioma of skin and subcutaneous tissue: Principal | ICD-10-CM

## 2021-04-01 MED ORDER — CLOBETASOL 0.05 % TOPICAL CREAM
OPHTHALMIC | 6 refills | 0.00000 days | Status: CP
Start: 2021-04-01 — End: ?

## 2021-04-01 NOTE — Telephone Encounter (Signed)
Labs reviewed.  GI PCR panel and fecal calprotectin both negative/normal.  Fecal pancreatic elastase still pending.  Will await that result, but assuming this is also normal, per her discussion with Estill Bamberg in the office last week, can trial course of rifaximin 550 mg tid x14 days, RF 1.  Recommend taking a probiotic during the course of rifaximin which should be extended for weeks beyond completion of rifaximin course (does not need to be taken indefinitely).

## 2021-04-04 ENCOUNTER — Other Ambulatory Visit: Payer: Self-pay

## 2021-04-04 DIAGNOSIS — K580 Irritable bowel syndrome with diarrhea: Secondary | ICD-10-CM

## 2021-04-04 LAB — CLOSTRIDIUM DIFFICILE TOXIN B, QUALITATIVE, REAL-TIME PCR: Toxigenic C. Difficile by PCR: NOT DETECTED

## 2021-04-04 LAB — PANCREATIC ELASTASE, FECAL: Pancreatic Elastase-1, Stool: 318 mcg/g

## 2021-04-04 MED ORDER — RIFAXIMIN 550 MG PO TABS: 550.0000 mg | ORAL_TABLET | Freq: Three times a day (TID) | ORAL | 1 refills | Status: DC

## 2021-04-05 ENCOUNTER — Other Ambulatory Visit: Payer: Self-pay

## 2021-04-05 DIAGNOSIS — K580 Irritable bowel syndrome with diarrhea: Secondary | ICD-10-CM

## 2021-04-05 MED ORDER — RIFAXIMIN 550 MG PO TABS: 550.0000 mg | ORAL_TABLET | Freq: Three times a day (TID) | ORAL | 1 refills | Status: DC

## 2021-04-12 ENCOUNTER — Telehealth: Payer: Self-pay | Admitting: *Deleted

## 2021-04-12 ENCOUNTER — Encounter: Payer: Self-pay | Admitting: Gastroenterology

## 2021-04-12 NOTE — Telephone Encounter (Signed)
Pharmacy states Clenpiq is $180. We do not have coupon available at this time. Spoke with patient she will go by [employer] office on Monday to pick up sample- CMA has one on hold for this patient.  ?

## 2021-04-16 ENCOUNTER — Encounter: Payer: Self-pay | Admitting: Certified Registered Nurse Anesthetist

## 2021-04-17 ENCOUNTER — Encounter: Payer: Self-pay | Admitting: Gastroenterology

## 2021-04-17 ENCOUNTER — Other Ambulatory Visit: Payer: Self-pay | Admitting: Gastroenterology

## 2021-04-17 ENCOUNTER — Ambulatory Visit (AMBULATORY_SURGERY_CENTER): Payer: BC Managed Care – PPO | Admitting: Gastroenterology

## 2021-04-17 VITALS — BP 149/83 | HR 87 | Temp 97.1°F | Resp 22 | Ht 64.0 in | Wt 114.0 lb

## 2021-04-17 DIAGNOSIS — K52832 Lymphocytic colitis: Secondary | ICD-10-CM | POA: Diagnosis not present

## 2021-04-17 DIAGNOSIS — K641 Second degree hemorrhoids: Secondary | ICD-10-CM

## 2021-04-17 DIAGNOSIS — K580 Irritable bowel syndrome with diarrhea: Secondary | ICD-10-CM

## 2021-04-17 DIAGNOSIS — Z1211 Encounter for screening for malignant neoplasm of colon: Secondary | ICD-10-CM

## 2021-04-17 DIAGNOSIS — R197 Diarrhea, unspecified: Secondary | ICD-10-CM

## 2021-04-17 MED ORDER — SODIUM CHLORIDE 0.9 % IV SOLN: 500.0000 mL | Freq: Once | INTRAVENOUS | Status: DC

## 2021-04-17 NOTE — Progress Notes (Signed)
Called to room to assist during endoscopic procedure.  Patient ID and intended procedure confirmed with present staff. Received instructions for my participation in the procedure from the performing physician.  

## 2021-04-17 NOTE — Progress Notes (Signed)
VS- Nash Mantis ? ?Cell phone off per pt; ?

## 2021-04-17 NOTE — Progress Notes (Signed)
1411 Patient experiencing nausea and vomiting.  MD updated and Zofran 4 mg IV given, vss  ?

## 2021-04-17 NOTE — Patient Instructions (Signed)
Resume previous diet and continue present medications. ?Await pathology results. ?Use fiber, for example Citrucel, Fibercon, Konsyl or Metamucil. ?Repeat colonoscopy in 10 years for screening purposes.  ?Return to GI office as needed. ? ? ?YOU HAD AN ENDOSCOPIC PROCEDURE TODAY AT [facility]:   Refer to the procedure report that was given to you for any specific questions about what was found during the examination.  If the procedure report does not answer your questions, please call your gastroenterologist to clarify.  If you requested that your care partner not be given the details of your procedure findings, then the procedure report has been included in a sealed envelope for you to review at your convenience later. ? ?YOU SHOULD EXPECT: Some feelings of bloating in the abdomen. Passage of more gas than usual.  Walking can help get rid of the air that was put into your GI tract during the procedure and reduce the bloating. If you had a lower endoscopy (such as a colonoscopy or flexible sigmoidoscopy) you may notice spotting of blood in your stool or on the toilet paper. If you underwent a bowel prep for your procedure, you may not have a normal bowel movement for a few days. ? ?Please Note:  You might notice some irritation and congestion in your nose or some drainage.  This is from the oxygen used during your procedure.  There is no need for concern and it should clear up in a day or so. ? ?SYMPTOMS TO REPORT IMMEDIATELY: ? ?Following lower endoscopy (colonoscopy or flexible sigmoidoscopy): ? Excessive amounts of blood in the stool ? Significant tenderness or worsening of abdominal pains ? Swelling of the abdomen that is new, acute ? Fever of 100?F or higher ? ?For urgent or emergent issues, a gastroenterologist can be reached at any hour by calling [phone]. ?Do not use MyChart messaging for urgent concerns.  ? ? ?DIET:  We do recommend a small meal at first, but then you may proceed to  your regular diet.  Drink plenty of fluids but you should avoid alcoholic beverages for 24 hours. ? ?ACTIVITY:  You should plan to take it easy for the rest of today and you should NOT DRIVE or use heavy machinery until tomorrow (because of the sedation medicines used during the test).   ? ?FOLLOW UP: ?Our staff will call the number listed on your records 48-72 hours following your procedure to check on you and address any questions or concerns that you may have regarding the information given to you following your procedure. If we do not reach you, we will leave a message.  We will attempt to reach you two times.  During this call, we will ask if you have developed any symptoms of COVID 19. If you develop any symptoms (ie: fever, flu-like symptoms, shortness of breath, cough etc.) before then, please call [phone].  If you test positive for Covid 19 in the 2 weeks post procedure, please call and report this information to [name].   ? ?If any biopsies were taken you will be contacted by phone or by letter within the next 1-3 weeks.  Please call us at [phone] if you have not heard about the biopsies in 3 weeks.  ? ? ?SIGNATURES/CONFIDENTIALITY: ?You and/or your care partner have signed paperwork which will be entered into your electronic medical record.  These signatures attest to the fact that that the information above on your After Visit Summary has been reviewed and is understood.  Full responsibility of the confidentiality of this discharge information lies with you and/or your care-partner.  ?

## 2021-04-17 NOTE — Progress Notes (Signed)
? ?GASTROENTEROLOGY PROCEDURE H&P NOTE  ? ?Primary Care Physician: ?Velna Hatchet, MD ? ? ? ?Reason for Procedure:   Diarrhea, colon cancer screening ? ?Plan:    Colonoscopy ? ?Patient is appropriate for endoscopic procedure(s) in the ambulatory ([place]) setting. ? ?The nature of the procedure, as well as the risks, benefits, and alternatives were carefully and thoroughly reviewed with the patient. Ample time for discussion and questions allowed. The patient understood, was satisfied, and agreed to proceed.  ? ? ? ?HPI: ?Tasha Jackson is a 66 y.o. female who presents for colonoscopy for evaluation of diarrhea along with CRC screening.  Patient was most recently seen in the Gastroenterology Clinic on 03/27/2021 by Vicie Mutters, PA-C.  No interval change in medical history since that appointment. Please refer to that note for full details regarding GI history and clinical presentation.  ? ?Past Medical History:  ?Diagnosis Date  ? Allergy   ? TAKE 3 PILLS ALL YEAR ROUND  ? Anxiety   ? Asthma   ? during exercise  ? HSV (herpes simplex virus) infection   ? Hypothyroidism   ? IBS (irritable bowel syndrome)   ? IBS (irritable bowel syndrome)   ? Lactose intolerance   ? Osteopenia   ? PVC (premature ventricular contraction)   ? ? ?Past Surgical History:  ?Procedure Laterality Date  ? ELBOW SURGERY Right 2007  ? Tennis elbow   ? ? ?Prior to Admission medications   ?Medication Sig Start Date End Date Taking? Authorizing Provider  ?azelastine (ASTELIN) 137 MCG/SPRAY nasal spray TWO SPRAYS IN EACH NOSTRIL TWICE DAILY 09/01/12  Yes Ricard Dillon, MD  ?cetirizine (ZYRTEC) 10 MG tablet Take 10 mg by mouth as needed.     Yes [provider]  ?Eluxadoline (VIBERZI) 100 MG TABS Take 1 tablet by mouth 2 (two) times daily. 01/18/20  Yes [provider]  ?latanoprost (XALATAN) 0.005 % ophthalmic solution Place 1 drop into both eyes at bedtime. 03/08/21  Yes [provider]  ?NP THYROID 60 MG tablet Take  60 mg by mouth daily. 03/18/21  Yes [provider]  ?ALPRAZolam Duanne Moron) 0.5 MG tablet TAKE ONE OR TWO TABLETS TWICE DAILY AS NEEDED FOR ANXIETY OR SLEEP    Ricard Dillon, MD  ?Ascorbic Acid (VITAMIN C) 1000 MG tablet Take 1,000 mg by mouth daily.   ?Patient not taking: Reported on 04/17/2021    [provider]  ?CELEBREX 200 MG capsule TAKE ONE CAPSULE EACH DAY 06/17/12   Ricard Dillon, MD  ?Cholecalciferol (VITAMIN D3) 1000 UNITS CAPS Take by mouth.      [provider]  ?clobetasol cream (TEMOVATE) 0.05 % Apply twice a day to affected areas of body.  Avoid face and folds 03/02/20   [provider]  ?diclofenac sodium (VOLTAREN) 1 % GEL Apply 2 g topically 2 (two) times daily as needed. 03/30/18   Candice Camp, MD  ?Halcinonide 0.1 % CREA Use on hands and arms as needed 08/15/15   [provider]  ?halobetasol (ULTRAVATE) 0.05 % cream Apply twice a day to affected areas of body.Marland Kitchenavoid face and folds 03/02/20   [provider]  ?ondansetron (ZOFRAN) 4 MG tablet Take 4 mg by mouth every 6 (six) hours as needed. 03/13/21   [provider]  ?rifaximin (XIFAXAN) 550 MG TABS tablet Take 1 tablet (550 mg total) by mouth 3 (three) times daily for 14 days. ?Patient not taking: Reported on 04/17/2021 04/05/21 04/19/21  Lavena Bullion,  DO  ? ? ?Current Outpatient Medications  ?Medication Sig Dispense Refill  ? azelastine (ASTELIN) 137 MCG/SPRAY nasal spray TWO SPRAYS IN EACH NOSTRIL TWICE DAILY 30 mL 11  ? cetirizine (ZYRTEC) 10 MG tablet Take 10 mg by mouth as needed.      ? Eluxadoline (VIBERZI) 100 MG TABS Take 1 tablet by mouth 2 (two) times daily.    ? latanoprost (XALATAN) 0.005 % ophthalmic solution Place 1 drop into both eyes at bedtime.    ? NP THYROID 60 MG tablet Take 60 mg by mouth daily.    ? ALPRAZolam (XANAX) 0.5 MG tablet TAKE ONE OR TWO TABLETS TWICE DAILY AS NEEDED FOR ANXIETY OR SLEEP 45 tablet 0  ? Ascorbic Acid (VITAMIN C) 1000 MG tablet Take  1,000 mg by mouth daily.   (Patient not taking: Reported on 04/17/2021)    ? CELEBREX 200 MG capsule TAKE ONE CAPSULE EACH DAY 30 capsule 3  ? Cholecalciferol (VITAMIN D3) 1000 UNITS CAPS Take by mouth.      ? clobetasol cream (TEMOVATE) 0.05 % Apply twice a day to affected areas of body.  Avoid face and folds    ? diclofenac sodium (VOLTAREN) 1 % GEL Apply 2 g topically 2 (two) times daily as needed. 100 g 1  ? Halcinonide 0.1 % CREA Use on hands and arms as needed    ? halobetasol (ULTRAVATE) 0.05 % cream Apply twice a day to affected areas of body.Marland Kitchenavoid face and folds    ? ondansetron (ZOFRAN) 4 MG tablet Take 4 mg by mouth every 6 (six) hours as needed.    ? rifaximin (XIFAXAN) 550 MG TABS tablet Take 1 tablet (550 mg total) by mouth 3 (three) times daily for 14 days. (Patient not taking: Reported on 04/17/2021) 42 tablet 1  ? ?Current Facility-Administered Medications  ?Medication Dose Route Frequency Provider Last Rate Last Admin  ? 0.9 %  sodium chloride infusion  500 mL Intravenous Once Artemisia Auvil V, DO      ? ? ?Allergies as of 04/17/2021 - Review Complete 04/17/2021  ?Allergen Reaction Noted  ? Amoxicillin-pot clavulanate    ? Fluticasone  03/06/2021  ? Lactose intolerance (gi)  03/27/2021  ? Other Itching 03/27/2021  ? Prednisone  03/06/2021  ? Sulfa antibiotics  03/06/2021  ? ? ?Family History  ?Problem Relation Age of Onset  ? Melanoma Mother   ? Hypertension Father   ? Heart disease Father   ? Breast cancer Maternal Grandmother   ?     Pt not sure date of onset  ? Colon cancer Neg Hx   ? ? ?Social History  ? ?Socioeconomic History  ? Marital status: Married  ?  Spouse name: Not on file  ? Number of children: 2  ? Years of education: Not on file  ? Highest education level: Not on file  ?Occupational History  ? Occupation: Merchandising  ?Tobacco Use  ? Smoking status: Former  ?  Types: Cigarettes  ?  Quit date: 10  ?  Years since quitting: 38.2  ? Smokeless tobacco: Never  ?Vaping Use  ? Vaping  Use: Never used  ?Substance and Sexual Activity  ? Alcohol use: Yes  ?  Comment: 2 drinks a day   ? Drug use: No  ? Sexual activity: Yes  ?Other Topics Concern  ? Not on file  ?Social History Narrative  ? 2 caffeine drinks daily   ? ?Social Determinants of Health  ? ?Financial Resource Strain: Not  on file  ?Food Insecurity: Not on file  ?Transportation Needs: Not on file  ?Physical Activity: Not on file  ?Stress: Not on file  ?Social Connections: Not on file  ?Intimate Partner Violence: Not on file  ? ? ?Physical Exam: ?Vital signs in last 24 hours: ?'@BP'$  (!) 158/85   Pulse 87   Temp (!) 97.1 ?F (36.2 ?C)   Ht '5\' 4"'$  (1.626 m)   Wt 114 lb (51.7 kg)   LMP 12/10/2010   SpO2 100%   BMI 19.57 kg/m?  ?GEN: NAD ?EYE: Sclerae anicteric ?ENT: MMM ?CV: Non-tachycardic ?Pulm: CTA b/l ?GI: Soft, NT/ND ?NEURO:  Alert & Oriented x 3 ? ? ?Gerrit Heck, DO ?[facility] ? ? ?04/17/2021 2:04 PM ? ?

## 2021-04-17 NOTE — Progress Notes (Signed)
Report given to PACU, vss 

## 2021-04-17 NOTE — Op Note (Signed)
[place] ?Patient Name: Tasha Jackson ?Procedure Date: 04/17/2021 2:10 PM ?MRN: [id number] ?Endoscopist: Gerrit Heck , MD ?Age: 66 ?Referring MD:  ?Date of Birth: Jun 08, 1955 ?Gender: Female ?Account #: [id number] ?Procedure:                Colonoscopy ?Indications:              Change in bowel habits, Diarrhea ?                          Additionally, she is due for repeat colonoscopy for  ?                          ongoing CRC screening. Last colonoscopy was 12/2100  ?                          and normal/no polyps. ?Medicines:                Monitored Anesthesia Care ?Procedure:                Pre-Anesthesia Assessment: ?                          - Prior to the procedure, a History and Physical  ?                          was performed, and patient medications and  ?                          allergies were reviewed. The patient's tolerance of  ?                          previous anesthesia was also reviewed. The risks  ?                          and benefits of the procedure and the sedation  ?                          options and risks were discussed with the patient.  ?                          All questions were answered, and informed consent  ?                          was obtained. Prior Anticoagulants: The patient has  ?                          taken no previous anticoagulant or antiplatelet  ?                          agents. ASA Grade Assessment: II - A patient with  ?                          mild systemic disease. After reviewing the risks  ?  and benefits, the patient was deemed in  ?                          satisfactory condition to undergo the procedure. ?                          After obtaining informed consent, the colonoscope  ?                          was passed under direct vision. Throughout the  ?                          procedure, the patient's blood pressure, pulse, and  ?                          oxygen saturations were monitored continuously. The  ?                           Olympus CF-HQ190L (Serial# [id number]) Colonoscope was  ?                          introduced through the anus and advanced to the the  ?                          terminal ileum. The colonoscopy was performed  ?                          without difficulty. The patient tolerated the  ?                          procedure well. The quality of the bowel  ?                          preparation was good. The terminal ileum, ileocecal  ?                          valve, appendiceal orifice, and rectum were  ?                          photographed. ?Scope In: 2:17:59 PM ?Scope Out: 2:37:11 PM ?Scope Withdrawal Time: 0 hours 14 minutes 51 seconds  ?Total Procedure Duration: 0 hours 19 minutes 12 seconds  ?Findings:                 Hemorrhoids were found on perianal exam. ?                          The entire colon appeared normal. Biopsies for  ?                          histology were taken with a cold forceps from the  ?                          right colon and left colon for evaluation of  ?  microscopic colitis. Estimated blood loss was  ?                          minimal. ?                          Non-bleeding internal hemorrhoids were found during  ?                          retroflexion. The hemorrhoids were small and Grade  ?                          II (internal hemorrhoids that prolapse but reduce  ?                          spontaneously). ?                          The terminal ileum appeared normal. ?Complications:            No immediate complications. ?Estimated Blood Loss:     Estimated blood loss was minimal. ?Impression:               - Hemorrhoids found on perianal exam. ?                          - The entire colon is normal. Biopsied. ?                          - Non-bleeding internal hemorrhoids. ?                          - The examined portion of the ileum was normal. ?Recommendation:           - Patient has a contact number available for  ?                           emergencies. The signs and symptoms of potential  ?                          delayed complications were discussed with the  ?                          patient. Return to normal activities tomorrow.  ?                          Written discharge instructions were provided to the  ?                          patient. ?                          - Resume previous diet. ?                          - Continue present medications. ?                          -  Await pathology results. ?                          - Use fiber, for example Citrucel, Fibercon, Konsyl  ?                          or Metamucil. ?                          - Repeat colonoscopy in 10 years for screening  ?                          purposes. ?                          - Return to GI office PRN. ?Gerrit Heck, MD ?04/17/2021 2:45:34 PM ?

## 2021-04-18 ENCOUNTER — Telehealth: Payer: Self-pay

## 2021-04-18 NOTE — Telephone Encounter (Signed)
-----   Message from Lavena Bullion, DO sent at 04/17/2021  2:46 PM EDT ----- ?Can you please cancel this patient's Rifaximin order? Has been hard to approve and she would like to hold off on trialing. Thanks.  ? ? ?

## 2021-04-18 NOTE — Telephone Encounter (Signed)
Attempted to call [facility] yesterday 04/17/21 to let them know pt would like to hold off on the xifaxan at this time. Stayed on hold for 15 min and then left voicemail for [facility] to return call.  ?

## 2021-04-18 NOTE — Telephone Encounter (Signed)
Medication discontinued in epic.  ?

## 2021-04-19 ENCOUNTER — Telehealth: Payer: Self-pay

## 2021-04-19 NOTE — Telephone Encounter (Signed)
First post procedure follow up call, no answer 

## 2021-04-28 ENCOUNTER — Encounter: Payer: Self-pay | Admitting: Gastroenterology

## 2021-04-29 ENCOUNTER — Other Ambulatory Visit: Payer: Self-pay

## 2021-04-29 DIAGNOSIS — M25512 Pain in left shoulder: Secondary | ICD-10-CM | POA: Diagnosis not present

## 2021-04-29 DIAGNOSIS — M542 Cervicalgia: Secondary | ICD-10-CM | POA: Diagnosis not present

## 2021-04-29 DIAGNOSIS — M25511 Pain in right shoulder: Secondary | ICD-10-CM | POA: Diagnosis not present

## 2021-04-29 MED ORDER — BUDESONIDE 3 MG PO CPEP: ORAL_CAPSULE | ORAL | 0 refills | Status: AC

## 2021-04-29 NOTE — Telephone Encounter (Signed)
Aminosalicylate have been trialed but not found to be effective in treating Lymphocytic Colitis.  I understand the hesitation with the budesonide given her history.  Increased ocular pressure is a reported ADR of systemic steroids (prednisone), but not reported on the ADR list of budesonide.  With that said, given her hesitation with that medication, we can instead plan to treat with cholestyramine and continued loperamide as outlined in a separate message sent.  Start cholestyramine at 4 g qid intake until diarrhea resolves (at least 2 weeks), then start to decrease to 4 g bid for continued resolution of symptoms. ?

## 2021-04-30 NOTE — Telephone Encounter (Signed)
Called pt and let her know recommendations. See 3/15 surgical pathology result note. ?

## 2021-05-03 DIAGNOSIS — H40023 Open angle with borderline findings, high risk, bilateral: Secondary | ICD-10-CM | POA: Diagnosis not present

## 2021-05-31 DIAGNOSIS — H40023 Open angle with borderline findings, high risk, bilateral: Secondary | ICD-10-CM | POA: Diagnosis not present

## 2021-06-19 DIAGNOSIS — Z1231 Encounter for screening mammogram for malignant neoplasm of breast: Secondary | ICD-10-CM | POA: Diagnosis not present

## 2021-06-19 DIAGNOSIS — Z124 Encounter for screening for malignant neoplasm of cervix: Secondary | ICD-10-CM | POA: Diagnosis not present

## 2021-06-19 DIAGNOSIS — Z01419 Encounter for gynecological examination (general) (routine) without abnormal findings: Secondary | ICD-10-CM | POA: Diagnosis not present

## 2021-07-23 DIAGNOSIS — H401131 Primary open-angle glaucoma, bilateral, mild stage: Secondary | ICD-10-CM | POA: Diagnosis not present

## 2021-07-23 DIAGNOSIS — H5203 Hypermetropia, bilateral: Secondary | ICD-10-CM | POA: Diagnosis not present

## 2021-11-09 DIAGNOSIS — Z23 Encounter for immunization: Secondary | ICD-10-CM | POA: Diagnosis not present

## 2021-11-12 ENCOUNTER — Ambulatory Visit
Admission: RE | Admit: 2021-11-12 | Discharge: 2021-11-12 | Disposition: A | Discharge status: Home / Self Care | Payer: BC Managed Care – PPO | Source: Ambulatory Visit | Attending: Sports Medicine | Admitting: Sports Medicine

## 2021-11-12 ENCOUNTER — Ambulatory Visit (INDEPENDENT_AMBULATORY_CARE_PROVIDER_SITE_OTHER): Payer: BC Managed Care – PPO | Admitting: Sports Medicine

## 2021-11-12 VITALS — BP 130/72 | Ht 64.0 in

## 2021-11-12 DIAGNOSIS — M25571 Pain in right ankle and joints of right foot: Secondary | ICD-10-CM

## 2021-11-12 DIAGNOSIS — M25531 Pain in right wrist: Secondary | ICD-10-CM | POA: Diagnosis not present

## 2021-11-12 DIAGNOSIS — M25561 Pain in right knee: Secondary | ICD-10-CM | POA: Diagnosis not present

## 2021-11-12 DIAGNOSIS — M7989 Other specified soft tissue disorders: Secondary | ICD-10-CM | POA: Diagnosis not present

## 2021-11-12 NOTE — Assessment & Plan Note (Signed)
X-rays were reviewed, no obvious fracture.  She does have a small abrasion.  At this point I do not think she is on any damage to the ligaments of her knee.  Recommend conservative treatment, ice and OTC pain medication as needed we will plan to see her for follow-up in 2 weeks

## 2021-11-12 NOTE — Progress Notes (Signed)
Established Patient Office Visit  Subjective   Patient ID: Tasha Jackson, female    DOB: Jun 13, 1955  Age: 66 y.o. MRN: [id number]  R wrist, knee and ankle pain after fall.   Ms. Daffin presents today with her husband with chief complaint of right wrist knee and ankle pain after a fall yesterday at tennis.  She does not remember exactly what happened but she tripped and had a twisting injury of her knee that caused her to fall on her knee and outstretched hand.  She has some pain in her ankle while ambulating superficial pain in her knee but worst pain in her wrist with associated swelling.  She had x-rays performed this morning of her ankle, knee and wrist.  No fracture seen.   ROSas listed above in HPI    Objective:     BP 130/72   Ht '5\' 4"'$  (1.626 m)   LMP 12/10/2010   BMI 19.57 kg/m   Physical Exam Vitals reviewed.  Constitutional:      General: She is not in acute distress.    Appearance: Normal appearance. She is normal weight. She is not ill-appearing, toxic-appearing or diaphoretic.  Pulmonary:     Effort: Pulmonary effort is normal.  Neurological:     Mental Status: She is alert.   Right ankle: No obvious deformity or asymmetry.  No ecchymosis or edema.  She does have some tenderness about the posterior tip of her lateral malleoli with in the region of her ATFL.  Full range of motion dorsiflexion, plantarflexion inversion and eversion.  Strength 5/5 with plantarflexion, dorsiflexion, inversion and eversion.  Negative anterior drawer test. Right knee: No obvious deformity or asymmetry.  No effusion present today.  She does have an abrasion over the lateral aspect of her knee.  Full range of motion with flexion and extension.  Stable to valgus and varus.  Negative Lachman's. Right wrist.  No obvious deformity.  She does have some swelling on the dorsal aspect of the wrist.  Tenderness to palpation of the proximal wrist.  Very slight pain near the area of the anatomical  snuffbox.  She has decreased range of motion with flexion and extension secondary to pain.  Grip strength 4/5 secondary to pain.  Sensation intact to light touch.     Assessment & Plan:   Problem List Items Addressed This Visit       Other   Wrist pain, acute, right - Primary    X-rays were reviewed, negative for any fracture.  Secondary to her swelling and pain we will place her in a thumb spica splint for the next 2 weeks and repeat imaging.  Patient may come out of the splint to wash her hands and sleep but she should be in the splint during the day.  She may use ice and topical anti-inflammatories for her pain.      Relevant Orders   DG Wrist Complete Right (Completed)   DG Wrist Complete Right   Acute pain of right knee    X-rays were reviewed, no obvious fracture.  She does have a small abrasion.  At this point I do not think she is on any damage to the ligaments of her knee.  Recommend conservative treatment, ice and OTC pain medication as needed we will plan to see her for follow-up in 2 weeks      Relevant Orders   DG Knee AP/LAT W/Sun[place] Right (Completed)   Pain in joint involving right ankle and foot  X-rays reviewed, negative for any fracture.  She does have some tenderness to palpation along her ATFL, suspect likely ankle sprain.  She was placed in an ankle compression sleeve.  Would recommend holding off on tennis at this time she may continue with her everyday activities of walking.  We will follow-up in 2 weeks.  She may use topical or oral anti-inflammatories as needed for pain.      Relevant Orders   DG Ankle Complete Right (Completed)    Return in about 2 weeks (around 11/26/2021).    Elmore Guise, DO  Patient seen and evaluated with the sports medicine fellow.  I agree with the above plan of care.  X-rays were reviewed.  No obvious fracture is seen but the patient does have swelling along the dorsum of the right wrist.  We will immobilize with a thumb  spica brace and she will follow-up in 2 weeks for repeat x-rays.  Reassurance regarding both the right ankle and right knee.

## 2021-11-12 NOTE — Assessment & Plan Note (Signed)
X-rays reviewed, negative for any fracture.  She does have some tenderness to palpation along her ATFL, suspect likely ankle sprain.  She was placed in an ankle compression sleeve.  Would recommend holding off on tennis at this time she may continue with her everyday activities of walking.  We will follow-up in 2 weeks.  She may use topical or oral anti-inflammatories as needed for pain.

## 2021-11-12 NOTE — Assessment & Plan Note (Signed)
X-rays were reviewed, negative for any fracture.  Secondary to her swelling and pain we will place her in a thumb spica splint for the next 2 weeks and repeat imaging.  Patient may come out of the splint to wash her hands and sleep but she should be in the splint during the day.  She may use ice and topical anti-inflammatories for her pain.

## 2021-11-26 ENCOUNTER — Ambulatory Visit (INDEPENDENT_AMBULATORY_CARE_PROVIDER_SITE_OTHER): Payer: BC Managed Care – PPO | Admitting: Sports Medicine

## 2021-11-26 ENCOUNTER — Ambulatory Visit
Admission: RE | Admit: 2021-11-26 | Discharge: 2021-11-26 | Disposition: A | Discharge status: Home / Self Care | Payer: BC Managed Care – PPO | Source: Ambulatory Visit | Attending: Sports Medicine | Admitting: Sports Medicine

## 2021-11-26 VITALS — BP 124/70 | Ht 64.0 in

## 2021-11-26 DIAGNOSIS — M25531 Pain in right wrist: Secondary | ICD-10-CM

## 2021-11-26 DIAGNOSIS — M25571 Pain in right ankle and joints of right foot: Secondary | ICD-10-CM | POA: Diagnosis not present

## 2021-11-26 DIAGNOSIS — M25561 Pain in right knee: Secondary | ICD-10-CM

## 2021-11-26 NOTE — Progress Notes (Signed)
   Subjective:    Patient ID: Tasha Jackson, female    DOB: [birth year], 66 y.o.   MRN: [id number]  HPI  [name] presents today for follow-up.  Right wrist is feeling much better although she does still endorse some slight discomfort across the dorsum of the wrist.  Knee and ankle pain are improving as well.  She has been wearing a thumb spica brace since her office visit with [name] 2 weeks ago.  Repeat x-rays today of the wrist shows no obvious fracture but she does have osteopenia.  Review of Systems As above    Objective:   Physical Exam  Well-developed, well-nourished.  No acute distress  Right wrist: She does have some slight stiffness in the wrist secondary to her immobilization.  Still no tenderness to palpation in the anatomic snuffbox.  Minimal tenderness to palpation across the dorsum of the wrist.  Right ankle: Good range of motion.  Negative talar tilt, negative anterior drawer.  Ambulating with a normal gait.  X-rays as above      Assessment & Plan:   Improving right wrist pain secondary to contusion Improving right knee pain likely secondary to DJD Improving right ankle pain secondary to ankle sprain  [name] will discontinue her thumb spica brace.  X-rays show no obvious fracture today.  She may increase activity as tolerated.  For her improving right ankle sprain, we will give her some home strengthening exercises to start doing daily.  I would also recommend that she wear her ASO brace when playing tennis for the next month or so.  Of note, she has been diagnosed with osteoporosis via DEXA scan by her PCP.  I encouraged her to discuss treatment options with them.  As long as she is able to return to her previous level of activity, she may follow-up with me as needed.  This note was dictated using Dragon naturally speaking software and may contain errors in syntax, spelling, or content which have not been identified prior to signing this note.

## 2021-12-23 DIAGNOSIS — M810 Age-related osteoporosis without current pathological fracture: Secondary | ICD-10-CM | POA: Diagnosis not present

## 2021-12-23 DIAGNOSIS — E785 Hyperlipidemia, unspecified: Secondary | ICD-10-CM | POA: Diagnosis not present

## 2021-12-23 DIAGNOSIS — D649 Anemia, unspecified: Secondary | ICD-10-CM | POA: Diagnosis not present

## 2021-12-23 DIAGNOSIS — E041 Nontoxic single thyroid nodule: Secondary | ICD-10-CM | POA: Diagnosis not present

## 2021-12-31 DIAGNOSIS — I493 Ventricular premature depolarization: Secondary | ICD-10-CM | POA: Diagnosis not present

## 2021-12-31 DIAGNOSIS — Z0000 Encounter for general adult medical examination without abnormal findings: Secondary | ICD-10-CM | POA: Diagnosis not present

## 2021-12-31 DIAGNOSIS — Z1331 Encounter for screening for depression: Secondary | ICD-10-CM | POA: Diagnosis not present

## 2021-12-31 DIAGNOSIS — Z1339 Encounter for screening examination for other mental health and behavioral disorders: Secondary | ICD-10-CM | POA: Diagnosis not present

## 2022-02-11 DIAGNOSIS — H401131 Primary open-angle glaucoma, bilateral, mild stage: Secondary | ICD-10-CM | POA: Diagnosis not present

## 2022-03-31 DIAGNOSIS — M542 Cervicalgia: Secondary | ICD-10-CM | POA: Diagnosis not present

## 2022-03-31 DIAGNOSIS — M25511 Pain in right shoulder: Secondary | ICD-10-CM | POA: Diagnosis not present

## 2022-04-03 DIAGNOSIS — M542 Cervicalgia: Secondary | ICD-10-CM | POA: Diagnosis not present

## 2022-04-03 DIAGNOSIS — M25511 Pain in right shoulder: Secondary | ICD-10-CM | POA: Diagnosis not present

## 2022-04-14 DIAGNOSIS — M25511 Pain in right shoulder: Secondary | ICD-10-CM | POA: Diagnosis not present

## 2022-04-14 DIAGNOSIS — M542 Cervicalgia: Secondary | ICD-10-CM | POA: Diagnosis not present

## 2022-04-17 ENCOUNTER — Ambulatory Visit (INDEPENDENT_AMBULATORY_CARE_PROVIDER_SITE_OTHER): Payer: BC Managed Care – PPO | Admitting: Sports Medicine

## 2022-04-17 VITALS — BP 130/86 | Ht 64.0 in | Wt 118.0 lb

## 2022-04-17 DIAGNOSIS — M7541 Impingement syndrome of right shoulder: Secondary | ICD-10-CM

## 2022-04-17 NOTE — Assessment & Plan Note (Signed)
Patient with positive empty can test on the right and difficulties with abduction above 90 degrees on right side.  Most consistent with rotator cuff impingement that is exacerbated by playing tennis.  Seems to be improving on Celebrex and we discussed that overhead motions can worsen this pain.  Does not appear to be frozen shoulder or adhesive capsulitis given patient has good external passive ROM on right side. -Continue Celebrex for 5 to 7 days -Reach out to PT about additional exercises -Follow-up in 4 weeks, consider full shoulder ultrasound at that time if continuing

## 2022-04-17 NOTE — Progress Notes (Signed)
SUBJECTIVE:   CHIEF COMPLAINT / HPI: Right shoulder pain   Here today for right shoulder pain since December 1st after lugging suitcase on spinner wheels through the airport  Has been playing tennis however has had a lot of aching after  Moving arm close to body, washing hair, makes it feel worse  Has taken celebrex for 3 days so far and it helped   On 1st of March carried suitcase up stairs again and flared up  Reached out to her physical therapist who saw her last week and recommended resistance bands strengthening exercises and celebrex. After seeing her after at the beginning of this week her therapist told her to schedule an appointment here  Has hx of frozen shoulder in right and has gotten injections in her right shoulder previously-she is not sure when the last time was  No direct trauma to her shoulder  PERTINENT  PMH / PSH: IBS, thyroid nodule, osteoporosis  OBJECTIVE:   BP 130/86   Ht '5\' 4"'$  (1.626 m)   Wt 118 lb (53.5 kg)   LMP 12/10/2010   BMI 20.25 kg/m   Gen: NAD, alert and responsive to all questions, pleasant Shoulder (bilateral): mild TTP noted at the lateral anterior aspect of right shoulder; none on left. No  skin changes, erythema, or ecchymosis noted. No evidence of bony deformity, asymmetry, or muscle atrophy; No tenderness over long head of biceps (bicipital groove). No TTP at [facility] joint. Some limitations with active ROM with abduction (around 90 degrees) and internal rotation on right side due to pain. Full range of motion (180 flex /60Ext /90ER) and full ROM on left side, Thumb to T12 with mild tenderness on right. Strength 5/5 throughout. No abnormal scapular function observed. Sensation to light touch intact. Peripheral pulses intact.   Special Tests:   - Painful Arc present at 90 degrees on right   - Empty can: positive on right   - Int/Ext Rotation test: NEG   - Hawkins: NEG -Obrien's test: NEG   - Yergason's: NEG  ASSESSMENT/PLAN:    Rotator cuff impingement syndrome of right shoulder Patient with positive empty can test on the right and difficulties with abduction above 90 degrees on right side.  Most consistent with rotator cuff impingement that is exacerbated by playing tennis.  Seems to be improving on Celebrex and we discussed that overhead motions can worsen this pain.  Does not appear to be frozen shoulder or adhesive capsulitis given patient has good external passive ROM on right side. -Continue Celebrex for 5 to 7 days -Reach out to PT about additional exercises -Follow-up in 4 weeks, consider full shoulder ultrasound at that time if continuing    Gerrit Heck, MD [place] Resident   Patient seen and evaluated with the resident.  I agree with the above plan of care.  Physical exam today suggest symptoms are likely secondary to rotator cuff impingement and adhesive capsulitis.  She will continue with her Celebrex for the next 5 to 7 days.  She will also contact her physical therapist about additional rotator cuff exercises.  We will schedule a tentative follow-up appointment for 4 weeks from now.  If symptoms persist at that time, we will proceed with a diagnostic ultrasound and will discuss merits of possible subacromial cortisone injection.  She understands that this is a condition that may acutely worsen if she overdoes it with tennis.  This note was dictated using Dragon naturally speaking software and may contain errors  in syntax, spelling, or content which have not been identified prior to signing this note.

## 2022-04-17 NOTE — Patient Instructions (Signed)
Continue celebrex for 5-7 days with meals; may take Tylenol as needed Follow-up in 4 weeks Reach out to PT for specific exercises for rotator cuff impingement

## 2022-04-18 ENCOUNTER — Ambulatory Visit (INDEPENDENT_AMBULATORY_CARE_PROVIDER_SITE_OTHER): Payer: BC Managed Care – PPO | Admitting: Family Medicine

## 2022-04-18 VITALS — BP 134/80 | Ht 64.0 in | Wt 118.0 lb

## 2022-04-18 DIAGNOSIS — M7541 Impingement syndrome of right shoulder: Secondary | ICD-10-CM | POA: Diagnosis not present

## 2022-04-18 MED ORDER — METHYLPREDNISOLONE ACETATE 40 MG/ML IJ SUSP
40.0000 mg | Freq: Once | INTRAMUSCULAR | Status: AC
  Administered 2022-04-18: 40 mg via INTRA_ARTICULAR

## 2022-04-18 NOTE — Progress Notes (Signed)
SMC: Attending Note: I have examined the patient, reviewed the chart, discussed the assessment and plan with the Sports Medicine Fellow. I agree with assessment and treatment plan as detailed in the Fellow's note.  

## 2022-04-18 NOTE — Progress Notes (Signed)
   Established Patient Office Visit  Subjective   Patient ID: Tasha Jackson, female    DOB: May 15, 1955  Age: 67 y.o. MRN: [id number]  Right shoulder pain requesting injection.  Tasha Jackson is here today with chief complaint of right shoulder pain requesting injection.  She was seen here in our clinic yesterday diagnosed with impingement.  She plays tennis quite frequently.  She is been working on some physical therapy taking Celebrex.  She came back for the injection as she is going to have some time away from tennis and she is going on a trip and has had injections before that have given her good relief.   ROS as listed above in HPI    Objective:     BP 134/80   Ht 5\' 4"  (1.626 m)   Wt 118 lb (53.5 kg)   LMP 12/10/2010   BMI 20.25 kg/m   Physical Exam Vitals reviewed.  Constitutional:      General: She is not in acute distress.    Appearance: Normal appearance. She is normal weight. She is not ill-appearing, toxic-appearing or diaphoretic.  Pulmonary:     Effort: Pulmonary effort is normal.  Neurological:     Mental Status: She is alert.   Right shoulder: No obvious deformity or asymmetry.  No tenderness to palpation of her AC joint.  She does have some tenderness to palpation about the lateral shoulder.  Full range of motion with forward flexion.  Some decreased range of motion with abduction secondary to pain.  Positive empty can testing weakness and pain.  Positive Hawkins.  Positive cross arm testing.    Assessment & Plan:   Problem List Items Addressed This Visit       Musculoskeletal and Integument   Rotator cuff impingement syndrome of right shoulder - Primary    Patient is to continue her home exercises and physical therapy.  We proceeded with an injection today into her subacromial space.  She also has some time off from tennis over the next few days.  Will see how she does with this injection and physical therapy.  Follow-up in 4 to 6 weeks.      After  informed written consent timeout was performed, patient was seated in chair in exam room. Right shoulder was prepped with alcohol swab and utilizing lateral approach with ultrasound guidance, patient's right subacromial space was injected with 4:1 lidocaine: depomedrol. Patient tolerated the procedure well without immediate complications.   Return in about 4 weeks (around 05/16/2022).    Elmore Guise, DO

## 2022-04-18 NOTE — Assessment & Plan Note (Signed)
Patient is to continue her home exercises and physical therapy.  We proceeded with an injection today into her subacromial space.  She also has some time off from tennis over the next few days.  Will see how she does with this injection and physical therapy.  Follow-up in 4 to 6 weeks.

## 2022-05-15 ENCOUNTER — Ambulatory Visit (INDEPENDENT_AMBULATORY_CARE_PROVIDER_SITE_OTHER): Payer: BC Managed Care – PPO | Admitting: Sports Medicine

## 2022-05-15 VITALS — BP 130/84 | Ht 65.0 in | Wt 118.0 lb

## 2022-05-15 DIAGNOSIS — M7541 Impingement syndrome of right shoulder: Secondary | ICD-10-CM | POA: Diagnosis not present

## 2022-05-15 NOTE — Progress Notes (Signed)
   Subjective:    Patient ID: Tasha Jackson, female    DOB: January 31, 1956, 67 y.o.   MRN: [id number]  HPI  Tasha Jackson presents today for follow-up on right shoulder pain felt to be secondary to rotator cuff impingement.  After her office visit with me on March 14, she returned the following day for a subacromial cortisone injection.  Unfortunately, she does not think that injection was very beneficial.  Despite that, she has still been able to stay very active.  In fact, she was able to play tennis 3 times last week.  She continues to have a nagging pain diffusely throughout the shoulder.  Celebrex continues to be effective.  Review of Systems As above    Objective:   Physical Exam  Well-developed, well-nourished.  No acute distress  Right shoulder: Full active and passive range of motion with a positive painful arc.  Positive empty can.  Rotator cuff strength is 5/5 with reproducible pain with resisted supraspinatus and external rotation.  No tenderness to palpation over the bicipital groove nor over the acromioclavicular joint.  Negative O'Brien's.     Assessment & Plan:   Persistent right shoulder pain secondary to rotator cuff impingement  I would like for [name] to enroll in formal physical therapy.  There is a physical therapist at [facility] physical therapy Engineer, materials) that she really likes.  Once she has 3 weeks of formal physical therapy under her belt, she will follow-up with me again in the office.  If still symptomatic at that time, we will proceed with point-of-care ultrasound.  This note was dictated using Dragon naturally speaking software and may contain errors in syntax, spelling, or content which have not been identified prior to signing this note.

## 2022-05-19 ENCOUNTER — Telehealth: Payer: Self-pay | Admitting: Gastroenterology

## 2022-05-19 ENCOUNTER — Other Ambulatory Visit: Payer: Self-pay

## 2022-05-19 MED ORDER — BUDESONIDE 3 MG PO CPEP: ORAL_CAPSULE | ORAL | 0 refills | Status: DC

## 2022-05-19 NOTE — Telephone Encounter (Signed)
Given previous good response but recent relapse of symptoms, will plan for the following:  - Start budesonide 9 mg daily for 8 weeks then taper to 6 mg x 4 weeks, then maintain prolonged low dose therapy with 3 mg daily x6 months, then can discontinue again.  - Patient to call if not efficacious or breakthrough sxs during the taper phase

## 2022-05-19 NOTE — Telephone Encounter (Signed)
Pt states she was diagnosed with lymphocytic colitis last year and was placed on budesonide and everything cleared up. Reports for the past month she has been having at least 3-4 diarrhea stools/day. She has tried the low fodmap diet but continues to have diarrhea-multiple times/day. Pt is requesting to start on budesonide again. Please advise.

## 2022-05-19 NOTE — Telephone Encounter (Signed)
Spoke with pt and she is aware of Dr. Ignacia Bayley recommenations, script sent to pharmacy.

## 2022-05-19 NOTE — Telephone Encounter (Signed)
PT is having a colitis flare and wants to start the budesonide treatment. Please advise.

## 2022-05-20 DIAGNOSIS — M25511 Pain in right shoulder: Secondary | ICD-10-CM | POA: Diagnosis not present

## 2022-05-20 DIAGNOSIS — M542 Cervicalgia: Secondary | ICD-10-CM | POA: Diagnosis not present

## 2022-05-22 DIAGNOSIS — M25511 Pain in right shoulder: Secondary | ICD-10-CM | POA: Diagnosis not present

## 2022-05-22 DIAGNOSIS — M542 Cervicalgia: Secondary | ICD-10-CM | POA: Diagnosis not present

## 2022-05-26 DIAGNOSIS — M542 Cervicalgia: Secondary | ICD-10-CM | POA: Diagnosis not present

## 2022-05-26 DIAGNOSIS — M25511 Pain in right shoulder: Secondary | ICD-10-CM | POA: Diagnosis not present

## 2022-05-28 DIAGNOSIS — M25511 Pain in right shoulder: Secondary | ICD-10-CM | POA: Diagnosis not present

## 2022-05-28 DIAGNOSIS — M542 Cervicalgia: Secondary | ICD-10-CM | POA: Diagnosis not present

## 2022-06-04 DIAGNOSIS — M25511 Pain in right shoulder: Secondary | ICD-10-CM | POA: Diagnosis not present

## 2022-06-04 DIAGNOSIS — M542 Cervicalgia: Secondary | ICD-10-CM | POA: Diagnosis not present

## 2022-06-17 ENCOUNTER — Ambulatory Visit: Payer: BC Managed Care – PPO | Admitting: Sports Medicine

## 2022-07-15 DIAGNOSIS — Z01419 Encounter for gynecological examination (general) (routine) without abnormal findings: Secondary | ICD-10-CM | POA: Diagnosis not present

## 2022-07-15 DIAGNOSIS — Z681 Body mass index (BMI) 19 or less, adult: Secondary | ICD-10-CM | POA: Diagnosis not present

## 2022-07-15 DIAGNOSIS — Z124 Encounter for screening for malignant neoplasm of cervix: Secondary | ICD-10-CM | POA: Diagnosis not present

## 2022-07-15 DIAGNOSIS — Z1231 Encounter for screening mammogram for malignant neoplasm of breast: Secondary | ICD-10-CM | POA: Diagnosis not present

## 2022-09-22 DIAGNOSIS — J019 Acute sinusitis, unspecified: Secondary | ICD-10-CM | POA: Diagnosis not present

## 2022-10-17 DIAGNOSIS — H401131 Primary open-angle glaucoma, bilateral, mild stage: Secondary | ICD-10-CM | POA: Diagnosis not present

## 2022-10-17 DIAGNOSIS — H2513 Age-related nuclear cataract, bilateral: Secondary | ICD-10-CM | POA: Diagnosis not present

## 2022-10-17 DIAGNOSIS — H53143 Visual discomfort, bilateral: Secondary | ICD-10-CM | POA: Diagnosis not present

## 2022-11-03 ENCOUNTER — Other Ambulatory Visit: Payer: Self-pay | Admitting: Gastroenterology

## 2022-12-22 DIAGNOSIS — R69 Illness, unspecified: Secondary | ICD-10-CM | POA: Diagnosis not present

## 2022-12-26 DIAGNOSIS — D649 Anemia, unspecified: Secondary | ICD-10-CM | POA: Diagnosis not present

## 2022-12-26 DIAGNOSIS — F419 Anxiety disorder, unspecified: Secondary | ICD-10-CM | POA: Diagnosis not present

## 2022-12-26 DIAGNOSIS — E785 Hyperlipidemia, unspecified: Secondary | ICD-10-CM | POA: Diagnosis not present

## 2022-12-26 DIAGNOSIS — R7989 Other specified abnormal findings of blood chemistry: Secondary | ICD-10-CM | POA: Diagnosis not present

## 2022-12-26 DIAGNOSIS — M810 Age-related osteoporosis without current pathological fracture: Secondary | ICD-10-CM | POA: Diagnosis not present

## 2022-12-26 DIAGNOSIS — E041 Nontoxic single thyroid nodule: Secondary | ICD-10-CM | POA: Diagnosis not present

## 2022-12-26 DIAGNOSIS — Z79899 Other long term (current) drug therapy: Secondary | ICD-10-CM | POA: Diagnosis not present

## 2023-01-06 DIAGNOSIS — E785 Hyperlipidemia, unspecified: Secondary | ICD-10-CM | POA: Diagnosis not present

## 2023-01-06 DIAGNOSIS — K589 Irritable bowel syndrome without diarrhea: Secondary | ICD-10-CM | POA: Diagnosis not present

## 2023-01-06 DIAGNOSIS — F419 Anxiety disorder, unspecified: Secondary | ICD-10-CM | POA: Diagnosis not present

## 2023-01-06 DIAGNOSIS — G4700 Insomnia, unspecified: Secondary | ICD-10-CM | POA: Diagnosis not present

## 2023-01-06 DIAGNOSIS — Z0000 Encounter for general adult medical examination without abnormal findings: Secondary | ICD-10-CM | POA: Diagnosis not present

## 2023-01-06 DIAGNOSIS — H9319 Tinnitus, unspecified ear: Secondary | ICD-10-CM | POA: Diagnosis not present

## 2023-01-06 DIAGNOSIS — M810 Age-related osteoporosis without current pathological fracture: Secondary | ICD-10-CM | POA: Diagnosis not present

## 2023-01-06 DIAGNOSIS — M170 Bilateral primary osteoarthritis of knee: Secondary | ICD-10-CM | POA: Diagnosis not present

## 2023-01-06 DIAGNOSIS — H40059 Ocular hypertension, unspecified eye: Secondary | ICD-10-CM | POA: Diagnosis not present

## 2023-01-06 DIAGNOSIS — I493 Ventricular premature depolarization: Secondary | ICD-10-CM | POA: Diagnosis not present

## 2023-01-09 DIAGNOSIS — R69 Illness, unspecified: Secondary | ICD-10-CM | POA: Diagnosis not present

## 2023-04-13 ENCOUNTER — Ambulatory Visit
Admission: RE | Admit: 2023-04-13 | Discharge: 2023-04-13 | Disposition: A | Discharge status: Home / Self Care | Source: Ambulatory Visit | Attending: Family Medicine | Admitting: Family Medicine

## 2023-04-13 ENCOUNTER — Other Ambulatory Visit: Payer: Self-pay | Admitting: Family Medicine

## 2023-04-13 DIAGNOSIS — R918 Other nonspecific abnormal finding of lung field: Secondary | ICD-10-CM | POA: Diagnosis not present

## 2023-04-13 DIAGNOSIS — R059 Cough, unspecified: Secondary | ICD-10-CM

## 2023-04-21 DIAGNOSIS — H401131 Primary open-angle glaucoma, bilateral, mild stage: Secondary | ICD-10-CM | POA: Diagnosis not present

## 2023-05-12 ENCOUNTER — Other Ambulatory Visit: Payer: Self-pay | Admitting: Family Medicine

## 2023-05-12 ENCOUNTER — Ambulatory Visit
Admission: RE | Admit: 2023-05-12 | Discharge: 2023-05-12 | Disposition: A | Discharge status: Home / Self Care | Source: Ambulatory Visit | Attending: Family Medicine | Admitting: Family Medicine

## 2023-05-12 DIAGNOSIS — R059 Cough, unspecified: Secondary | ICD-10-CM | POA: Diagnosis not present

## 2023-05-19 LAB — LAB REPORT - SCANNED
A1c: 5.5
EGFR (Non-African Amer.): 99
Free T4: 1.09 ng/dL
TSH: 0.724

## 2023-05-26 DIAGNOSIS — Z90722 Acquired absence of ovaries, bilateral: Secondary | ICD-10-CM | POA: Diagnosis not present

## 2023-05-26 DIAGNOSIS — N958 Other specified menopausal and perimenopausal disorders: Secondary | ICD-10-CM | POA: Diagnosis not present

## 2023-05-26 DIAGNOSIS — M8588 Other specified disorders of bone density and structure, other site: Secondary | ICD-10-CM | POA: Diagnosis not present

## 2023-06-18 ENCOUNTER — Encounter: Payer: Self-pay | Admitting: Pulmonary Disease

## 2023-06-18 ENCOUNTER — Ambulatory Visit: Admitting: Pulmonary Disease

## 2023-06-18 ENCOUNTER — Telehealth: Payer: Self-pay

## 2023-06-18 VITALS — BP 128/82 | HR 68 | Ht 64.0 in | Wt 118.6 lb

## 2023-06-18 DIAGNOSIS — Z87891 Personal history of nicotine dependence: Secondary | ICD-10-CM | POA: Diagnosis not present

## 2023-06-18 DIAGNOSIS — R053 Chronic cough: Secondary | ICD-10-CM

## 2023-06-18 DIAGNOSIS — R0982 Postnasal drip: Secondary | ICD-10-CM | POA: Diagnosis not present

## 2023-06-18 LAB — CBC WITH DIFFERENTIAL/PLATELET
Basophils Absolute: 0.1 10*3/uL (ref 0.0–0.1)
Basophils Relative: 1.4 % (ref 0.0–3.0)
Eosinophils Absolute: 0.3 10*3/uL (ref 0.0–0.7)
Eosinophils Relative: 5.7 % — ABNORMAL HIGH (ref 0.0–5.0)
HCT: 39.1 % (ref 36.0–46.0)
Hemoglobin: 12.8 g/dL (ref 12.0–15.0)
Lymphocytes Relative: 32.5 % (ref 12.0–46.0)
Lymphs Abs: 1.6 10*3/uL (ref 0.7–4.0)
MCHC: 32.7 g/dL (ref 30.0–36.0)
MCV: 90.9 fl (ref 78.0–100.0)
Monocytes Absolute: 0.4 10*3/uL (ref 0.1–1.0)
Monocytes Relative: 8.6 % (ref 3.0–12.0)
Neutro Abs: 2.5 10*3/uL (ref 1.4–7.7)
Neutrophils Relative %: 51.8 % (ref 43.0–77.0)
Platelets: 379 10*3/uL (ref 150.0–400.0)
RBC: 4.31 Mil/uL (ref 3.87–5.11)
RDW: 13.4 % (ref 11.5–15.5)
WBC: 4.9 10*3/uL (ref 4.0–10.5)

## 2023-06-18 LAB — SEDIMENTATION RATE: Sed Rate: 20 mm/h (ref 0–30)

## 2023-06-18 LAB — C-REACTIVE PROTEIN: CRP: <1 mg/dL (ref 0.5–20.0)

## 2023-06-18 NOTE — Telephone Encounter (Signed)
 Faxed records request.

## 2023-06-18 NOTE — Progress Notes (Signed)
 Synopsis: Referred in May 2025 for pneumonia  Subjective:   PATIENT ID: Tasha Jackson GENDER: female DOB: July 25, 1955, MRN: [id number]  HPI  Chief Complaint  Patient presents with   Consult    Pt state CC x 02/25, Xrays in chart    Tasha Jackson is a 68 year old woman, former smoker with history of asthma and osteoporosis of her hips/back who is referred to pulmonary clinic for cough.   The cough began in February as a dry, hacking cough while traveling and worsened upon returning. Despite treatment with antibiotics, prednisone, and a nebulizer with budesonide , the cough persists. It occurs twice daily, is severe enough to induce gagging, and has become slightly more productive over the last month without significant mucus expectoration. Night sweats are present, but there is no fever, chills, or significant mucus production. Deep breaths can trigger the cough, and there is occasional chest heaviness. Physical activity does not exacerbate the cough.  She experiences postnasal drainage and seasonal allergies, which are worse this spring. She uses azelastine  nasal spray and Zyrtec or Allegra. Due to glaucoma risk, she avoids Flonase unless necessary. Budesonide  nebulizer treatment for six weeks has not improved symptoms and caused her heart to race. There is no wheezing, shortness of breath, dry mouth, or heartburn. She has itchy eyes and allergic contact dermatitis.  Past Medical History:  Diagnosis Date   Allergy    TAKE 3 PILLS ALL YEAR ROUND   Anxiety    Asthma    during exercise   HSV (herpes simplex virus) infection    Hypothyroidism    IBS (irritable bowel syndrome)    IBS (irritable bowel syndrome)    Lactose intolerance    Osteopenia    Osteoporosis    PVC (premature ventricular contraction)      Family History  Problem Relation Age of Onset   Melanoma Mother    Hypertension Father    Heart disease Father    Breast cancer Maternal Grandmother        Pt not sure date  of onset   Colon cancer Neg Hx    Esophageal cancer Neg Hx    Rectal cancer Neg Hx    Stomach cancer Neg Hx      Social History   Socioeconomic History   Marital status: Married    Spouse name: Not on file   Number of children: 2   Years of education: Not on file   Highest education level: Not on file  Occupational History   Occupation: Merchandising  Tobacco Use   Smoking status: Former    Current packs/day: 0.00    Types: Cigarettes    Quit date: 1985    Years since quitting: 40.3   Smokeless tobacco: Never  Vaping Use   Vaping status: Never Used  Substance and Sexual Activity   Alcohol  use: Yes    Comment: 2 drinks a day    Drug use: No   Sexual activity: Yes  Other Topics Concern   Not on file  Social History Narrative   2 caffeine drinks daily    Social Drivers of Corporate investment banker Strain: Not on file  Food Insecurity: Not on file  Transportation Needs: Not on file  Physical Activity: Not on file  Stress: Not on file  Social Connections: Not on file  Intimate Partner Violence: Not on file     Allergies  Allergen Reactions   Amoxicillin-Pot Clavulanate     REACTION: GI upset  Fluticasone     Other reaction(s): increased eye pressures   Lactose Intolerance (Gi)    Other Itching    Certain Chemicals cause contact dermatitis-itching, redness and skin peels of hands    Prednisone     Other reaction(s): increased intraocular pressure   Sulfa Antibiotics     Other reaction(s): Unknown     Outpatient Medications Prior to Visit  Medication Sig Dispense Refill   ALPRAZolam  (XANAX ) 0.5 MG tablet TAKE ONE OR TWO TABLETS TWICE DAILY AS NEEDED FOR ANXIETY OR SLEEP 45 tablet 0   Ascorbic Acid (VITAMIN C) 1000 MG tablet Take 1,000 mg by mouth daily.     azelastine  (ASTELIN ) 137 MCG/SPRAY nasal spray TWO SPRAYS IN EACH NOSTRIL TWICE DAILY 30 mL 11   budesonide  (ENTOCORT EC ) 3 MG 24 hr capsule Take 1 capsule (3 mg total) by mouth daily. 30 capsule 3    CELEBREX  200 MG capsule TAKE ONE CAPSULE EACH DAY 30 capsule 3   cetirizine (ZYRTEC) 10 MG tablet Take 10 mg by mouth as needed.       Cholecalciferol (VITAMIN D3) 1000 UNITS CAPS Take by mouth.       clobetasol cream (TEMOVATE) 0.05 % Apply twice a day to affected areas of body.  Avoid face and folds     diclofenac  sodium (VOLTAREN ) 1 % GEL Apply 2 g topically 2 (two) times daily as needed. 100 g 1   Eluxadoline (VIBERZI) 100 MG TABS Take 1 tablet by mouth 2 (two) times daily.     Halcinonide 0.1 % CREA Use on hands and arms as needed     halobetasol (ULTRAVATE) 0.05 % cream Apply twice a day to affected areas of body.Aaron Aasavoid face and folds     latanoprost (XALATAN) 0.005 % ophthalmic solution Place 1 drop into both eyes at bedtime.     NP THYROID  60 MG tablet Take 60 mg by mouth daily.     ondansetron (ZOFRAN) 4 MG tablet Take 4 mg by mouth every 6 (six) hours as needed.     No facility-administered medications prior to visit.    Review of Systems  Constitutional:  Negative for chills, fever, malaise/fatigue and weight loss.  HENT:  Negative for congestion, sinus pain and sore throat.   Eyes: Negative.   Respiratory:  Positive for cough. Negative for hemoptysis, sputum production, shortness of breath and wheezing.   Cardiovascular:  Negative for chest pain, palpitations, orthopnea, claudication and leg swelling.  Gastrointestinal:  Negative for abdominal pain, heartburn, nausea and vomiting.  Genitourinary: Negative.   Musculoskeletal:  Positive for joint pain. Negative for myalgias.  Skin:  Negative for rash.  Neurological:  Negative for weakness.  Endo/Heme/Allergies: Negative.   Psychiatric/Behavioral: Negative.      Objective:   Vitals:   06/18/23 0852  BP: 128/82  Pulse: 68  SpO2: 94%  Weight: 118 lb 9.6 oz (53.8 kg)  Height: 5\' 4"  (1.626 m)   Physical Exam Constitutional:      General: She is not in acute distress.    Appearance: Normal appearance.  Eyes:      General: No scleral icterus.    Conjunctiva/sclera: Conjunctivae normal.  Cardiovascular:     Rate and Rhythm: Normal rate and regular rhythm.  Pulmonary:     Breath sounds: No wheezing, rhonchi or rales.  Musculoskeletal:     Right lower leg: No edema.     Left lower leg: No edema.  Skin:    General: Skin is warm and dry.  Neurological:     General: No focal deficit present.    CBC    Component Value Date/Time   WBC 4.8 09/24/2011 1050   RBC 4.32 09/24/2011 1050   HGB 13.6 09/24/2011 1050   HCT 40.8 09/24/2011 1050   PLT 338.0 09/24/2011 1050   MCV 94.4 09/24/2011 1050   MCHC 33.3 09/24/2011 1050   RDW 13.1 09/24/2011 1050   LYMPHSABS 1.4 09/24/2011 1050   MONOABS 0.4 09/24/2011 1050   EOSABS 0.2 09/24/2011 1050   BASOSABS 0.0 09/24/2011 1050      Latest Ref Rng & Units 09/24/2011   10:50 AM 12/04/2009   10:28 AM 07/07/2007    8:38 AM  BMP  Glucose 70 - 99 mg/dL 82  77  80   BUN 6 - 23 mg/dL 13  12  12    Creatinine 0.4 - 1.2 mg/dL 0.6  0.7  0.7   Sodium 135 - 145 mEq/L 138  139  136   Potassium 3.5 - 5.1 mEq/L 4.4  4.4  4.0   Chloride 96 - 112 mEq/L 102  105  113   CO2 19 - 32 mEq/L 30  28  30    Calcium 8.4 - 10.5 mg/dL 9.7  9.1  8.4    Chest imaging: CXR 05/12/23 Partial resolution of right-sided pneumonia. Followup PA and lateral chest X-ray is recommended in another 3-4 weeks to assure resolution.  CXR 04/14/23 Ill-defined patchy pulmonary infiltrates in the right upper right middle lobe and lingula. May correlate with pneumonia pneumonitis.   PFT:     No data to display          Labs:  Path:  Echo:  Heart Catheterization:    Assessment & Plan:   Chronic cough - Plan: CBC with Differential/Platelet, IgE, ANA, Anti-Smith antibody, C-reactive protein, Cyclic citrul peptide antibody, IgG, Hypersensitivity Pneumonitis, Rheumatoid factor, Sedimentation rate, Sjogren's syndrome antibods(ssa + ssb), CT CHEST HIGH RESOLUTION, Pulmonary Function Test,  CANCELED: CT CHEST HIGH RESOLUTION  Post-nasal drip  Discussion: Tasha Jackson is a 68 year old woman, former smoker with history of asthma and osteoporosis of her hips/back who is referred to pulmonary clinic for cough.   Chronic cough Persistent cough with scattered infiltrates on chest radiographs. Differential includes organizing pneumonia vs ILD vs post-nasal drip vs asthma.  - Order CBC and IgE level. - Order autoimmune and inflammatory labs. - Order CT scan of the chest. - Schedule pulmonary function test at [facility]. - Request records from Dr. Magnus Schuller.  Follow up in 1 month  Duaine German, MD [facility] Office: [phone]    Current Outpatient Medications:    ALPRAZolam  (XANAX ) 0.5 MG tablet, TAKE ONE OR TWO TABLETS TWICE DAILY AS NEEDED FOR ANXIETY OR SLEEP, Disp: 45 tablet, Rfl: 0   Ascorbic Acid (VITAMIN C) 1000 MG tablet, Take 1,000 mg by mouth daily., Disp: , Rfl:    azelastine  (ASTELIN ) 137 MCG/SPRAY nasal spray, TWO SPRAYS IN EACH NOSTRIL TWICE DAILY, Disp: 30 mL, Rfl: 11   budesonide  (ENTOCORT EC ) 3 MG 24 hr capsule, Take 1 capsule (3 mg total) by mouth daily., Disp: 30 capsule, Rfl: 3   CELEBREX  200 MG capsule, TAKE ONE CAPSULE EACH DAY, Disp: 30 capsule, Rfl: 3   cetirizine (ZYRTEC) 10 MG tablet, Take 10 mg by mouth as needed.  , Disp: , Rfl:    Cholecalciferol (VITAMIN D3) 1000 UNITS CAPS, Take by mouth.  , Disp: , Rfl:    clobetasol cream (TEMOVATE)  0.05 %, Apply twice a day to affected areas of body.  Avoid face and folds, Disp: , Rfl:    diclofenac  sodium (VOLTAREN ) 1 % GEL, Apply 2 g topically 2 (two) times daily as needed., Disp: 100 g, Rfl: 1   Eluxadoline (VIBERZI) 100 MG TABS, Take 1 tablet by mouth 2 (two) times daily., Disp: , Rfl:    Halcinonide 0.1 % CREA, Use on hands and arms as needed, Disp: , Rfl:    halobetasol (ULTRAVATE) 0.05 % cream, Apply twice a day to affected areas of body.Aaron Aasavoid face and folds, Disp: , Rfl:     latanoprost (XALATAN) 0.005 % ophthalmic solution, Place 1 drop into both eyes at bedtime., Disp: , Rfl:    NP THYROID  60 MG tablet, Take 60 mg by mouth daily., Disp: , Rfl:    ondansetron (ZOFRAN) 4 MG tablet, Take 4 mg by mouth every 6 (six) hours as needed., Disp: , Rfl:

## 2023-06-18 NOTE — Patient Instructions (Addendum)
 We will check inflammatory labs today  We will schedule you for high resolution CT Chest scan   Once he have more information from the labs and scan, we will work on a treatment plan.  Follow up in 1 month to review results.

## 2023-06-19 ENCOUNTER — Ambulatory Visit
Admission: RE | Admit: 2023-06-19 | Discharge: 2023-06-19 | Disposition: A | Discharge status: Home / Self Care | Source: Ambulatory Visit | Attending: Pulmonary Disease | Admitting: Pulmonary Disease

## 2023-06-19 DIAGNOSIS — R918 Other nonspecific abnormal finding of lung field: Secondary | ICD-10-CM | POA: Diagnosis not present

## 2023-06-19 DIAGNOSIS — I700 Atherosclerosis of aorta: Secondary | ICD-10-CM | POA: Diagnosis not present

## 2023-06-19 DIAGNOSIS — R053 Chronic cough: Secondary | ICD-10-CM

## 2023-06-19 DIAGNOSIS — J479 Bronchiectasis, uncomplicated: Secondary | ICD-10-CM | POA: Diagnosis not present

## 2023-06-20 LAB — ANTI-NUCLEAR AB-TITER (ANA TITER)
ANA TITER: 1:40 {titer} — ABNORMAL HIGH
ANA Titer 1: 1:40 {titer} — ABNORMAL HIGH

## 2023-06-20 LAB — SJOGREN'S SYNDROME ANTIBODS(SSA + SSB)
SSA (Ro) (ENA) Antibody, IgG: <1 AI
SSB (La) (ENA) Antibody, IgG: <1 AI

## 2023-06-20 LAB — ANTI-SMITH ANTIBODY: ENA SM Ab Ser-aCnc: <1 AI

## 2023-06-20 LAB — IGE: IgE (Immunoglobulin E), Serum: 23 kU/L (ref <=114)

## 2023-06-20 LAB — ANA: Anti Nuclear Antibody (ANA): POSITIVE — AB

## 2023-06-20 LAB — CYCLIC CITRUL PEPTIDE ANTIBODY, IGG: Cyclic Citrullin Peptide Ab: <16 U

## 2023-06-20 LAB — RHEUMATOID FACTOR: Rheumatoid fact SerPl-aCnc: <10 [IU]/mL (ref <14)

## 2023-06-24 LAB — HYPERSENSITIVITY PNEUMONITIS
A. Pullulans Abs: NEGATIVE
A.Fumigatus #1 Abs: NEGATIVE
Micropolyspora faeni, IgG: NEGATIVE
Pigeon Serum Abs: NEGATIVE
Thermoact. Saccharii: NEGATIVE
Thermoactinomyces vulgaris, IgG: NEGATIVE

## 2023-07-01 ENCOUNTER — Telehealth: Payer: Self-pay

## 2023-07-01 DIAGNOSIS — R053 Chronic cough: Secondary | ICD-10-CM

## 2023-07-01 NOTE — Telephone Encounter (Signed)
 Copied from CRM [id number]. Topic: General - Other >> Jun 30, 2023  8:31 AM Tasha Jackson O wrote: Reason for CRM: [facility] [place] [facility] wanted to make sure we received the results for the patient  Did check patient chart and I do see the results and did relay that information  Results are in patient chart nfn

## 2023-07-01 NOTE — Telephone Encounter (Signed)
 Copied from CRM [id number]. Topic: Clinical - Lab/Test Results >> Jun 30, 2023 11:32 AM Hilton Lucky wrote: Reason for CRM: Patient noting that she got her CT results. Informed patient provider has yet to review them but will call once results have been reviewed and plan of care has been assessed. Please call patient when able regarding results.  Called patient and informed them I would send a message to Dr.Dewald in regards to resulting her ct scan and will get back to her.Routing to Dr.Dewald

## 2023-07-02 NOTE — Telephone Encounter (Signed)
 Let patient know that her CT Chest scan shows mild bronchiectasis which is dilation and thickening of the airways in the right middle lung. There is also a consolidation in the right middle lobe concerning for on going inflammation from her recent pneumonia.   Her inflammatory lab testing is negative. The ANA test is mildly positive which is not concerning.   I would recommend a prolonged prednisone taper over a 4 week period to see if this helps improve her symptoms. If she is ok with this, I will send in prescription.  We will repeat a CT Chest scan in the future to monitor these findings.  Dr. Diania Fortes

## 2023-07-02 NOTE — Telephone Encounter (Signed)
 Called patient and patient is aware of ct and lab work and patient does not want to take prednisone due to the increased taking it ans she has developed osteoporosis and wants an alternative medication if possible.

## 2023-07-03 NOTE — Telephone Encounter (Signed)
 Prednisone is the best treatment for organizing pneumonia. Other option is to just monitor for now and check in on her symptoms at follow up.  JD

## 2023-07-13 ENCOUNTER — Other Ambulatory Visit: Payer: Self-pay | Admitting: Family Medicine

## 2023-07-13 DIAGNOSIS — I7090 Unspecified atherosclerosis: Secondary | ICD-10-CM

## 2023-07-20 MED ORDER — BUDESONIDE-FORMOTEROL FUMARATE 160-4.5 MCG/ACT IN AERO: 2.0000 | INHALATION_SPRAY | Freq: Two times a day (BID) | RESPIRATORY_TRACT | 2 refills | Status: DC

## 2023-07-20 NOTE — Telephone Encounter (Signed)
 I called and spoke to pt. Pt informed of Dr Reine Caraway note and verbalized understanding. Pt willing to try Symbicort. Sending this to pharmacy. Nfn

## 2023-07-20 NOTE — Telephone Encounter (Signed)
 Per our last office visit she was on budesonide  nebulizer treatments without improvement. We can try an inhaled steroid via inhaler but may not give the benefit we are looking for.   If she wants to try inhaler, recommend symbicort 160-4.75mcg 2 puffs twice daily.  Tasha Jackson

## 2023-07-20 NOTE — Addendum Note (Signed)
 Addended by: Norleen Xie T on: 07/20/2023 04:58 PM   Modules accepted: Orders

## 2023-07-20 NOTE — Telephone Encounter (Signed)
 Please advise patient message.

## 2023-07-21 ENCOUNTER — Telehealth: Payer: Self-pay

## 2023-07-21 ENCOUNTER — Ambulatory Visit
Admission: RE | Admit: 2023-07-21 | Discharge: 2023-07-21 | Disposition: A | Discharge status: Home / Self Care | Source: Ambulatory Visit | Attending: Family Medicine | Admitting: Family Medicine

## 2023-07-21 ENCOUNTER — Other Ambulatory Visit (HOSPITAL_COMMUNITY): Payer: Self-pay

## 2023-07-21 DIAGNOSIS — I7090 Unspecified atherosclerosis: Secondary | ICD-10-CM

## 2023-07-21 NOTE — Telephone Encounter (Signed)
*  Pulm  Pharmacy Patient Advocate Encounter   Received notification from CoverMyMeds that prior authorization for Breyna 160-4.5MCG/ACT aerosol  is required/requested.   Insurance verification completed.   The patient is insured through [employer] .   Per test claim: Refill too soon. PA is not needed at this time. Medication was filled 07/20/2023. Next eligible fill date is 08/12/2023.

## 2023-07-27 ENCOUNTER — Ambulatory Visit (HOSPITAL_BASED_OUTPATIENT_CLINIC_OR_DEPARTMENT_OTHER): Admitting: Pulmonary Disease

## 2023-07-27 DIAGNOSIS — R053 Chronic cough: Secondary | ICD-10-CM | POA: Diagnosis not present

## 2023-07-27 LAB — PULMONARY FUNCTION TEST
DL/VA % pred: 134 %
DL/VA: 5.62 ml/min/mmHg/L
DLCO cor % pred: 104 %
DLCO cor: 20.71 ml/min/mmHg
DLCO unc % pred: 102 %
DLCO unc: 20.32 ml/min/mmHg
FEF 25-75 Post: 2.85 L/s
FEF 25-75 Pre: 2.5 L/s
FEF2575-%Change-Post: 14 %
FEF2575-%Pred-Post: 133 %
FEF2575-%Pred-Pre: 117 %
FEV1-%Change-Post: 7 %
FEV1-%Pred-Post: 93 %
FEV1-%Pred-Pre: 87 %
FEV1-Post: 2.25 L
FEV1-Pre: 2.1 L
FEV1FVC-%Change-Post: -7 %
FEV1FVC-%Pred-Pre: 112 %
FEV6-%Change-Post: 17 %
FEV6-%Pred-Post: 92 %
FEV6-%Pred-Pre: 78 %
FEV6-Post: 2.8 L
FEV6-Pre: 2.38 L
FEV6FVC-%Pred-Post: 104 %
FEV6FVC-%Pred-Pre: 104 %
FVC-%Change-Post: 15 %
FVC-%Pred-Post: 88 %
FVC-%Pred-Pre: 76 %
FVC-Post: 2.8 L
FVC-Pre: 2.43 L
Post FEV1/FVC ratio: 80 %
Post FEV6/FVC ratio: 100 %
Pre FEV1/FVC ratio: 87 %
Pre FEV6/FVC Ratio: 100 %
RV % pred: 78 %
RV: 1.65 L
TLC % pred: 110 %
TLC: 5.58 L

## 2023-07-27 NOTE — Patient Instructions (Signed)
 Full PFT performed today.

## 2023-07-27 NOTE — Progress Notes (Signed)
 Full PFT performed today.

## 2023-08-03 NOTE — Telephone Encounter (Signed)
Please place patient on wait list.

## 2023-08-04 ENCOUNTER — Telehealth: Payer: Self-pay

## 2023-08-04 DIAGNOSIS — R053 Chronic cough: Secondary | ICD-10-CM

## 2023-08-04 DIAGNOSIS — Z1231 Encounter for screening mammogram for malignant neoplasm of breast: Secondary | ICD-10-CM | POA: Diagnosis not present

## 2023-08-04 DIAGNOSIS — Z124 Encounter for screening for malignant neoplasm of cervix: Secondary | ICD-10-CM | POA: Diagnosis not present

## 2023-08-04 DIAGNOSIS — Z01419 Encounter for gynecological examination (general) (routine) without abnormal findings: Secondary | ICD-10-CM | POA: Diagnosis not present

## 2023-08-04 NOTE — Telephone Encounter (Signed)
 Patients breathing tests are normal.  CT scan results discussed back in phone note on 07/01/23.   We can discuss more in person at her follow up visit 7/16.  JD

## 2023-08-04 NOTE — Telephone Encounter (Signed)
 Copied from CRM [id number]. Topic: Clinical - Medical Advice >> Aug 03, 2023 11:49 AM Corean SAUNDERS wrote: Reason for CRM: Patient is requesting a call back from from Dr. Ples nurse about her CT scan but declined to provide more information.  Spoke with patient regarding prior message . Patient stated she has had her PFT on 07/27/2023 and patient was wondering if there is another inhaler patient has been using symbicort. Patient asked for possibly just a steroid inhaler .  Dr.Dewald can you please advise   Thank you

## 2023-08-05 NOTE — Telephone Encounter (Signed)
 Patient states since last CT she has had another one and states  the lungs look worse since last CT         And can you please send in a different inhaler the one she currently using makes her shake

## 2023-08-06 MED ORDER — ARNUITY ELLIPTA 200 MCG/ACT IN AEPB: 1.0000 | INHALATION_SPRAY | Freq: Every day | RESPIRATORY_TRACT | 2 refills | Status: AC

## 2023-08-06 NOTE — Telephone Encounter (Signed)
 Arnuity Ellipta 1 puff daily sent to her pharmacy.  Please get a copy of the most recent CT chest that she has had done. I do not see it in the [facility] or Care everywhere records.

## 2023-08-06 NOTE — Telephone Encounter (Signed)
 Spoke with patent she will be uploading it to her mychart portal.     NFN

## 2023-08-06 NOTE — Addendum Note (Signed)
 Addended by: Abryanna Musolino on: 08/06/2023 02:04 PM   Modules accepted: Orders

## 2023-08-19 ENCOUNTER — Encounter: Payer: Self-pay | Admitting: Pulmonary Disease

## 2023-08-19 ENCOUNTER — Ambulatory Visit: Admitting: Pulmonary Disease

## 2023-08-19 VITALS — BP 149/78 | HR 90 | Ht 64.0 in | Wt 118.6 lb

## 2023-08-19 DIAGNOSIS — J479 Bronchiectasis, uncomplicated: Secondary | ICD-10-CM

## 2023-08-19 DIAGNOSIS — R053 Chronic cough: Secondary | ICD-10-CM

## 2023-08-19 MED ORDER — SODIUM CHLORIDE 3 % IN NEBU: INHALATION_SOLUTION | Freq: Two times a day (BID) | RESPIRATORY_TRACT | 12 refills | Status: AC | PRN

## 2023-08-19 NOTE — Progress Notes (Signed)
 Synopsis: Referred in May 2025 for pneumonia  Subjective:   PATIENT ID: Tasha Jackson GENDER: female DOB: 1955/09/18, MRN: [id number]  HPI  Chief Complaint  Patient presents with   Follow-up    pft   Sueanne Maniaci is a 68 year old woman, former smoker with history of asthma and osteoporosis of her hips/back who returns to pulmonary clinic for cough.   Initial OV 06/18/23 The cough began in February as a dry, hacking cough while traveling and worsened upon returning. Despite treatment with antibiotics, prednisone, and a nebulizer with budesonide , the cough persists. It occurs twice daily, is severe enough to induce gagging, and has become slightly more productive over the last month without significant mucus expectoration. Night sweats are present, but there is no fever, chills, or significant mucus production. Deep breaths can trigger the cough, and there is occasional chest heaviness. Physical activity does not exacerbate the cough.  She experiences postnasal drainage and seasonal allergies, which are worse this spring. She uses azelastine  nasal spray and Zyrtec or Allegra. Due to glaucoma risk, she avoids Flonase unless necessary. Budesonide  nebulizer treatment for six weeks has not improved symptoms and caused her heart to race. There is no wheezing, shortness of breath, dry mouth, or heartburn. She has itchy eyes and allergic contact dermatitis.  OV 08/20/23 She continues to have cough that is intermittently productive. She does report some improvement in her cough with using arnuity ellipta  1 puff daily. She denies fevers, chills and sweats. No weight changes.  HRCT Chest does show some areas of bronchiectasis and scattered patchy peribronchovascular ground-glass consolidations. We reviewed these results and discussed potential for organizing pneumonia vs indolent infection like MAC. Discussed potential need for bronchoscopy with biopsies vs bronchoalveolar lavage alone.   Past Medical  History:  Diagnosis Date   Allergy    TAKE 3 PILLS ALL YEAR ROUND   Anxiety    Asthma    during exercise   HSV (herpes simplex virus) infection    Hypothyroidism    IBS (irritable bowel syndrome)    IBS (irritable bowel syndrome)    Lactose intolerance    Osteopenia    Osteoporosis    PVC (premature ventricular contraction)      Family History  Problem Relation Age of Onset   Melanoma Mother    Hypertension Father    Heart disease Father    Breast cancer Maternal Grandmother        Pt not sure date of onset   Colon cancer Neg Hx    Esophageal cancer Neg Hx    Rectal cancer Neg Hx    Stomach cancer Neg Hx      Social History   Socioeconomic History   Marital status: Married    Spouse name: Not on file   Number of children: 2   Years of education: Not on file   Highest education level: Not on file  Occupational History   Occupation: Merchandising  Tobacco Use   Smoking status: Former    Current packs/day: 0.00    Types: Cigarettes    Quit date: 1985    Years since quitting: 40.5   Smokeless tobacco: Never  Vaping Use   Vaping status: Never Used  Substance and Sexual Activity   Alcohol  use: Yes    Comment: 2 drinks a day    Drug use: No   Sexual activity: Yes  Other Topics Concern   Not on file  Social History Narrative   2 caffeine drinks  daily    Social Drivers of Health   Financial Resource Strain: Low Risk  (08/26/2017)   Received from [facility]   Overall Financial Resource Strain (CARDIA)    Difficulty of Paying Living Expenses: Not very hard  Food Insecurity: No Food Insecurity (08/26/2017)   Received [facility]   Hunger Vital Sign    Worried About Running Out of Food in the Last Year: Never true    Ran Out of Food in the Last Year: Never true  Transportation Needs: No Transportation Needs (08/26/2017)   Received from [facility] - Transportation    Lack of Transportation (Medical): No    Lack of Transportation (Non-Medical):  No  Physical Activity: Sufficiently Active (08/26/2017)   Received from [facility]   Exercise Vital Sign    Days of Exercise per Week: 3 days    Minutes of Exercise per Session: 60 min  Stress: Stress Concern Present (08/26/2017)   Received from [facility] - Occupational Stress Questionnaire    Feeling of Stress : Rather much  Social Connections: Moderately Integrated (08/26/2017)   Received from [facility]   Social Connection and Isolation Panel    Frequency of Communication with Friends and Family: More than three times a week    Frequency of Social Gatherings with Friends and Family: Twice a week    Attends Religious Services: Never    Database administrator or Organizations: Yes    Attends Engineer, structural: More than 4 times per year    Marital Status: Married  Catering manager Violence: Not on file     Allergies  Allergen Reactions   Amoxicillin-Pot Clavulanate     REACTION: GI upset   Fluticasone     Other reaction(s): increased eye pressures   Lactose Intolerance (Gi)    Other Itching    Certain Chemicals cause contact dermatitis-itching, redness and skin peels of hands    Prednisone     Other reaction(s): increased intraocular pressure   Sulfa Antibiotics     Other reaction(s): Unknown     Outpatient Medications Prior to Visit  Medication Sig Dispense Refill   ALPRAZolam  (XANAX ) 0.5 MG tablet TAKE ONE OR TWO TABLETS TWICE DAILY AS NEEDED FOR ANXIETY OR SLEEP 45 tablet 0   azelastine  (ASTELIN ) 137 MCG/SPRAY nasal spray TWO SPRAYS IN EACH NOSTRIL TWICE DAILY 30 mL 11   CELEBREX  200 MG capsule TAKE ONE CAPSULE EACH DAY 30 capsule 3   cetirizine (ZYRTEC) 10 MG tablet Take 10 mg by mouth as needed.       Cholecalciferol (VITAMIN D3) 1000 UNITS CAPS Take by mouth.       clobetasol cream (TEMOVATE) 0.05 % Apply twice a day to affected areas of body.  Avoid face and folds     diclofenac  sodium (VOLTAREN ) 1 % GEL  Apply 2 g topically 2 (two) times daily as needed. 100 g 1   Fluticasone Furoate  (ARNUITY ELLIPTA ) 200 MCG/ACT AEPB Inhale 1 puff into the lungs daily. 30 each 2   Halcinonide 0.1 % CREA Use on hands and arms as needed     halobetasol (ULTRAVATE) 0.05 % cream Apply twice a day to affected areas of body.SABRAavoid face and folds     latanoprost (XALATAN) 0.005 % ophthalmic solution Place 1 drop into both eyes at bedtime.     NP THYROID  60 MG tablet Take 60 mg by mouth daily.  ondansetron (ZOFRAN) 4 MG tablet Take 4 mg by mouth every 6 (six) hours as needed.     Ascorbic Acid (VITAMIN C) 1000 MG tablet Take 1,000 mg by mouth daily. (Patient not taking: Reported on 08/19/2023)     Eluxadoline (VIBERZI) 100 MG TABS Take 1 tablet by mouth 2 (two) times daily. (Patient not taking: Reported on 08/19/2023)     budesonide  (ENTOCORT EC ) 3 MG 24 hr capsule Take 1 capsule (3 mg total) by mouth daily. 30 capsule 3   No facility-administered medications prior to visit.    Review of Systems  Constitutional:  Negative for chills, fever, malaise/fatigue and weight loss.  HENT:  Negative for congestion, sinus pain and sore throat.   Eyes: Negative.   Respiratory:  Positive for cough. Negative for hemoptysis, sputum production, shortness of breath and wheezing.   Cardiovascular:  Negative for chest pain, palpitations, orthopnea, claudication and leg swelling.  Gastrointestinal:  Negative for abdominal pain, heartburn, nausea and vomiting.  Genitourinary: Negative.   Musculoskeletal:  Positive for joint pain. Negative for myalgias.  Skin:  Negative for rash.  Neurological:  Negative for weakness.  Endo/Heme/Allergies: Negative.   Psychiatric/Behavioral: Negative.      Objective:   Vitals:   08/19/23 1524  BP: (!) 149/78  Pulse: 90  SpO2: 98%  Weight: 118 lb 9.6 oz (53.8 kg)  Height: 5' 4 (1.626 m)    Physical Exam Constitutional:      General: She is not in acute distress.    Appearance: Normal  appearance.  Eyes:     General: No scleral icterus.    Conjunctiva/sclera: Conjunctivae normal.  Cardiovascular:     Rate and Rhythm: Normal rate and regular rhythm.  Pulmonary:     Breath sounds: No wheezing, rhonchi or rales.  Musculoskeletal:     Right lower leg: No edema.     Left lower leg: No edema.  Skin:    General: Skin is warm and dry.  Neurological:     General: No focal deficit present.    CBC    Component Value Date/Time   WBC 4.9 06/18/2023 0932   RBC 4.31 06/18/2023 0932   HGB 12.8 06/18/2023 0932   HCT 39.1 06/18/2023 0932   PLT 379.0 06/18/2023 0932   MCV 90.9 06/18/2023 0932   MCHC 32.7 06/18/2023 0932   RDW 13.4 06/18/2023 0932   LYMPHSABS 1.6 06/18/2023 0932   MONOABS 0.4 06/18/2023 0932   EOSABS 0.3 06/18/2023 0932   BASOSABS 0.1 06/18/2023 0932      Latest Ref Rng & Units 09/24/2011   10:50 AM 12/04/2009   10:28 AM 07/07/2007    8:38 AM  BMP  Glucose 70 - 99 mg/dL 82  77  80   BUN 6 - 23 mg/dL 13  12  12    Creatinine 0.4 - 1.2 mg/dL 0.6  0.7  0.7   Sodium 135 - 145 mEq/L 138  139  136   Potassium 3.5 - 5.1 mEq/L 4.4  4.4  4.0   Chloride 96 - 112 mEq/L 102  105  113   CO2 19 - 32 mEq/L 30  28  30    Calcium 8.4 - 10.5 mg/dL 9.7  9.1  8.4    Chest imaging: HRCT Chest 06/19/23 1. No evidence of fibrotic interstitial lung disease. 2. Mild cylindrical bronchiectasis with scattered patchy peribronchovascular ground-glass, minimal nodularity and consolidation, findings which may be due to organizing pneumonia. Given the nodular configuration in the medial segment right  middle lobe, recommend follow-up CT chest without contrast in 1-2 months in further evaluation, as malignancy cannot be definitively excluded. 3. A few scattered pulmonary nodules measure 4 mm or less in size.  CXR 05/12/23 Partial resolution of right-sided pneumonia. Followup PA and lateral chest X-ray is recommended in another 3-4 weeks to assure resolution.  CXR  04/14/23 Ill-defined patchy pulmonary infiltrates in the right upper right middle lobe and lingula. May correlate with pneumonia pneumonitis.   PFT:    Latest Ref Rng & Units 07/27/2023    3:13 PM  PFT Results  FVC-Pre L 2.43   FVC-Predicted Pre % 76   FVC-Post L 2.80   FVC-Predicted Post % 88   Pre FEV1/FVC % % 87   Post FEV1/FCV % % 80   FEV1-Pre L 2.10   FEV1-Predicted Pre % 87   FEV1-Post L 2.25   DLCO uncorrected ml/min/mmHg 20.32   DLCO UNC% % 102   DLCO corrected ml/min/mmHg 20.71   DLCO COR %Predicted % 104   DLVA Predicted % 134   TLC L 5.58   TLC % Predicted % 110   RV % Predicted % 78   PFTs 07/27/23 are within normal limits.  Labs:  Path:  Echo:  Heart Catheterization:    Assessment & Plan:   Chronic cough  Bronchiectasis without complication (HCC) - Plan: sodium chloride  HYPERTONIC 3 % nebulizer solution, AFB Culture & Smear, Respiratory or Resp and Sputum Culture, Fungus Culture with Smear, CT Chest Wo Contrast  Discussion: Tasha Jackson is a 68 year old woman, former smoker with history of asthma and osteoporosis of her hips/back who returns to pulmonary clinic for cough.   Bronchiectasis  - autoimmune workup is unrevealing - recommend airway clearance with hypertonic nebs and flutter valve therapy - She does not wish to undergo bronchoscopy at this time - Will obtain sputum cultures - Continue arnuity ellitpa 1 puff daily - will repeat CT Chest scan in 2 months  Follow up in 6 weeks  Dorn Chill, MD [facility] Office: [phone]    Current Outpatient Medications:    ALPRAZolam  (XANAX ) 0.5 MG tablet, TAKE ONE OR TWO TABLETS TWICE DAILY AS NEEDED FOR ANXIETY OR SLEEP, Disp: 45 tablet, Rfl: 0   azelastine  (ASTELIN ) 137 MCG/SPRAY nasal spray, TWO SPRAYS IN EACH NOSTRIL TWICE DAILY, Disp: 30 mL, Rfl: 11   CELEBREX  200 MG capsule, TAKE ONE CAPSULE EACH DAY, Disp: 30 capsule, Rfl: 3   cetirizine (ZYRTEC) 10 MG tablet,  Take 10 mg by mouth as needed.  , Disp: , Rfl:    Cholecalciferol (VITAMIN D3) 1000 UNITS CAPS, Take by mouth.  , Disp: , Rfl:    clobetasol cream (TEMOVATE) 0.05 %, Apply twice a day to affected areas of body.  Avoid face and folds, Disp: , Rfl:    diclofenac  sodium (VOLTAREN ) 1 % GEL, Apply 2 g topically 2 (two) times daily as needed., Disp: 100 g, Rfl: 1   Fluticasone Furoate  (ARNUITY ELLIPTA ) 200 MCG/ACT AEPB, Inhale 1 puff into the lungs daily., Disp: 30 each, Rfl: 2   Halcinonide 0.1 % CREA, Use on hands and arms as needed, Disp: , Rfl:    halobetasol (ULTRAVATE) 0.05 % cream, Apply twice a day to affected areas of body.SABRAavoid face and folds, Disp: , Rfl:    latanoprost (XALATAN) 0.005 % ophthalmic solution, Place 1 drop into both eyes at bedtime., Disp: , Rfl:    NP THYROID  60 MG tablet, Take 60 mg  by mouth daily., Disp: , Rfl:    ondansetron (ZOFRAN) 4 MG tablet, Take 4 mg by mouth every 6 (six) hours as needed., Disp: , Rfl:    sodium chloride  HYPERTONIC 3 % nebulizer solution, Take by nebulization 2 (two) times daily as needed for other., Disp: 750 mL, Rfl: 12   Ascorbic Acid (VITAMIN C) 1000 MG tablet, Take 1,000 mg by mouth daily. (Patient not taking: Reported on 08/19/2023), Disp: , Rfl:    Eluxadoline (VIBERZI) 100 MG TABS, Take 1 tablet by mouth 2 (two) times daily. (Patient not taking: Reported on 08/19/2023), Disp: , Rfl:

## 2023-08-19 NOTE — Patient Instructions (Addendum)
 Your CT Chest scans show areas of bronchiectasis in the right lung  Start hypertonic saline nebulizer treatments twice daily followed by flutter valve  Continue arnuity ellipta  inhaler 1 puff daily  We will try to collect sputum samples to determine if any infection is adding to your cough  Consider bronchoscopy if for deep lung samples for cultures if unable to cough up adequate sputum sample  We will schedule you for bronchoscopy next month  Follow up in 6 weeks

## 2023-08-20 ENCOUNTER — Telehealth: Payer: Self-pay

## 2023-08-20 NOTE — Telephone Encounter (Signed)
 Copied from CRM [id number]. Topic: Appointments - Appointment Scheduling >> Aug 20, 2023 10:47 AM Charlanne KIDD wrote: Last AVS says: Consider bronchoscopy if for deep lung samples for cultures if unable to cough up adequate sputum sample  We will schedule you for bronchoscopy next month  Follow up in 6 weeks (Does Dr. Kara want us  to sched w/Ms. Groce for this 6 week FU after her Bronc? If so I can call and schedule her within this 6 week time frame.)    Patient schedule 9/3 @ 3pm    NFN-

## 2023-08-25 ENCOUNTER — Encounter: Payer: Self-pay | Admitting: Pulmonary Disease

## 2023-08-27 ENCOUNTER — Encounter: Payer: Self-pay | Admitting: Family Medicine

## 2023-09-04 ENCOUNTER — Ambulatory Visit (HOSPITAL_COMMUNITY)
Admission: RE | Admit: 2023-09-04 | Discharge: 2023-09-04 | Disposition: A | Discharge status: Home / Self Care | Source: Ambulatory Visit | Attending: Pulmonary Disease | Admitting: Pulmonary Disease

## 2023-09-04 DIAGNOSIS — J479 Bronchiectasis, uncomplicated: Secondary | ICD-10-CM | POA: Insufficient documentation

## 2023-09-04 DIAGNOSIS — R918 Other nonspecific abnormal finding of lung field: Secondary | ICD-10-CM | POA: Diagnosis not present

## 2023-09-04 DIAGNOSIS — I700 Atherosclerosis of aorta: Secondary | ICD-10-CM | POA: Diagnosis not present

## 2023-09-14 ENCOUNTER — Ambulatory Visit: Payer: Self-pay | Admitting: Pulmonary Disease

## 2023-10-07 ENCOUNTER — Ambulatory Visit: Admitting: Pulmonary Disease

## 2023-10-07 ENCOUNTER — Encounter: Payer: Self-pay | Admitting: Pulmonary Disease

## 2023-10-07 ENCOUNTER — Encounter (INDEPENDENT_AMBULATORY_CARE_PROVIDER_SITE_OTHER): Payer: Self-pay

## 2023-10-07 VITALS — BP 136/81 | HR 92 | Ht 64.0 in | Wt 116.0 lb

## 2023-10-07 DIAGNOSIS — J479 Bronchiectasis, uncomplicated: Secondary | ICD-10-CM

## 2023-10-07 DIAGNOSIS — R0982 Postnasal drip: Secondary | ICD-10-CM | POA: Diagnosis not present

## 2023-10-07 DIAGNOSIS — J181 Lobar pneumonia, unspecified organism: Secondary | ICD-10-CM | POA: Diagnosis not present

## 2023-10-07 DIAGNOSIS — R053 Chronic cough: Secondary | ICD-10-CM

## 2023-10-07 MED ORDER — IPRATROPIUM BROMIDE 0.03 % NA SOLN
2.0000 | Freq: Two times a day (BID) | NASAL | 12 refills | Status: AC
Start: 2023-10-07 — End: ?

## 2023-10-07 MED ORDER — FLUTICASONE PROPIONATE 50 MCG/ACT NA SUSP: 1.0000 | Freq: Every day | NASAL | 2 refills | Status: AC

## 2023-10-07 NOTE — Patient Instructions (Addendum)
 Start flonase  nasal spray, 1 spray per day per nostril  Start ipratropium nasal spray, 2 sprays per nostril twice daily  Stop astelin  nasal spray  Ok to stop hypertonic saline nebulizer treatments  Referral to ENT, Dr. Okey placed today to evaluate you for cough  We will consider treatment with famotidine 20mg  at bedtime in the future for silent reflux if you are still having issues with cough  Follow up CT Chest scan in February with follow up visit to review results

## 2023-10-07 NOTE — Progress Notes (Unsigned)
 Synopsis: Referred in May 2025 for pneumonia  Subjective:   PATIENT ID: Tasha Jackson Cera GENDER: female DOB: 1955-11-06, MRN: [id number]  HPI  Chief Complaint  Patient presents with   Medical Management of Chronic Issues    Pt states still coughing    Ketzia Guzek is a 68 year old woman, former smoker with history of asthma and osteoporosis of her hips/back who returns to pulmonary clinic for cough.   Initial OV 06/18/23 The cough began in February as a dry, hacking cough while traveling and worsened upon returning. Despite treatment with antibiotics, prednisone, and a nebulizer with budesonide , the cough persists. It occurs twice daily, is severe enough to induce gagging, and has become slightly more productive over the last month without significant mucus expectoration. Night sweats are present, but there is no fever, chills, or significant mucus production. Deep breaths can trigger the cough, and there is occasional chest heaviness. Physical activity does not exacerbate the cough.  She experiences postnasal drainage and seasonal allergies, which are worse this spring. She uses azelastine  nasal spray and Zyrtec or Allegra. Due to glaucoma risk, she avoids Flonase  unless necessary. Budesonide  nebulizer treatment for six weeks has not improved symptoms and caused her heart to race. There is no wheezing, shortness of breath, dry mouth, or heartburn. She has itchy eyes and allergic contact dermatitis.  OV 08/20/23 She continues to have cough that is intermittently productive. She does report some improvement in her cough with using arnuity ellipta  1 puff daily. She denies fevers, chills and sweats. No weight changes.  HRCT Chest does show some areas of bronchiectasis and scattered patchy peribronchovascular ground-glass consolidations. We reviewed these results and discussed potential for organizing pneumonia vs indolent infection like MAC. Discussed potential need for bronchoscopy with biopsies  vs bronchoalveolar lavage alone.   Past Medical History:  Diagnosis Date   Allergy    TAKE 3 PILLS ALL YEAR ROUND   Anxiety    Asthma    during exercise   HSV (herpes simplex virus) infection    Hypothyroidism    IBS (irritable bowel syndrome)    IBS (irritable bowel syndrome)    Lactose intolerance    Osteopenia    Osteoporosis    PVC (premature ventricular contraction)      Family History  Problem Relation Age of Onset   Melanoma Mother    Hypertension Father    Heart disease Father    Breast cancer Maternal Grandmother        Pt not sure date of onset   Colon cancer Neg Hx    Esophageal cancer Neg Hx    Rectal cancer Neg Hx    Stomach cancer Neg Hx      Social History   Socioeconomic History   Marital status: Married    Spouse name: Not on file   Number of children: 2   Years of education: Not on file   Highest education level: Not on file  Occupational History   Occupation: Merchandising  Tobacco Use   Smoking status: Former    Current packs/day: 0.00    Types: Cigarettes    Quit date: 1985    Years since quitting: 40.6   Smokeless tobacco: Never  Vaping Use   Vaping status: Never Used  Substance and Sexual Activity   Alcohol  use: Yes    Comment: 2 drinks a day    Drug use: No   Sexual activity: Yes  Other Topics Concern   Not on file  Social History Narrative   2 caffeine drinks daily    Social Drivers of Corporate investment banker Strain: Low Risk  (08/26/2017)   Received from [facility]   Overall Financial Resource Strain (CARDIA)    Difficulty of Paying Living Expenses: Not very hard  Food Insecurity: No Food Insecurity (08/26/2017)   Received from [facility]   Hunger Vital Sign    Worried About Running Out of Food in the Last Year: Never true    Ran Out of Food in the Last Year: Never true  Transportation Needs: No Transportation Needs (08/26/2017)   Received [facility] - [facility]    Lack of Transportation  (Medical): No    Lack of Transportation (Non-Medical): No  Physical Activity: Sufficiently Active (08/26/2017)   Received from [facility]   Exercise Vital Sign    Days of Exercise per Week: 3 days    Minutes of Exercise per Session: 60 min  Stress: Stress Concern Present (08/26/2017)   Received from [facility] - Occupational Stress Questionnaire    Feeling of Stress : Rather much  Social Connections: Moderately Integrated (08/26/2017)   Received from [facility]   Social Connection and Isolation Panel    Frequency of Communication with Friends and Family: More than three times a week    Frequency of Social Gatherings with Friends and Family: Twice a week    Attends Religious Services: Never    Database administrator or Organizations: Yes    Attends Engineer, structural: More than 4 times per year    Marital Status: Married  Catering manager Violence: Not on file     Allergies  Allergen Reactions   Amoxicillin-Pot Clavulanate     REACTION: GI upset   Fluticasone      Other reaction(s): increased eye pressures   Lactose Intolerance (Gi)    Other Itching    Certain Chemicals cause contact dermatitis-itching, redness and skin peels of hands    Prednisone     Other reaction(s): increased intraocular pressure   Sulfa Antibiotics     Other reaction(s): Unknown     Outpatient Medications Prior to Visit  Medication Sig Dispense Refill   ALPRAZolam  (XANAX ) 0.5 MG tablet TAKE ONE OR TWO TABLETS TWICE DAILY AS NEEDED FOR ANXIETY OR SLEEP 45 tablet 0   Ascorbic Acid (VITAMIN C) 1000 MG tablet Take 1,000 mg by mouth daily.     azelastine  (ASTELIN ) 137 MCG/SPRAY nasal spray TWO SPRAYS IN EACH NOSTRIL TWICE DAILY 30 mL 11   CELEBREX  200 MG capsule TAKE ONE CAPSULE EACH DAY 30 capsule 3   cetirizine (ZYRTEC) 10 MG tablet Take 10 mg by mouth as needed.       Cholecalciferol (VITAMIN D3) 1000 UNITS CAPS Take by mouth.       clobetasol  cream (TEMOVATE) 0.05 % Apply twice a day to affected areas of body.  Avoid face and folds     diclofenac  sodium (VOLTAREN ) 1 % GEL Apply 2 g topically 2 (two) times daily as needed. 100 g 1   Eluxadoline (VIBERZI) 100 MG TABS Take 1 tablet by mouth 2 (two) times daily.     Fluticasone  Furoate (ARNUITY ELLIPTA ) 200 MCG/ACT AEPB Inhale 1 puff into the lungs daily. 30 each 2   Halcinonide 0.1 % CREA Use on hands and arms as needed     halobetasol (ULTRAVATE) 0.05 % cream Apply twice a day to affected  areas of body.SABRAavoid face and folds     latanoprost (XALATAN) 0.005 % ophthalmic solution Place 1 drop into both eyes at bedtime.     NP THYROID  60 MG tablet Take 60 mg by mouth daily.     ondansetron (ZOFRAN) 4 MG tablet Take 4 mg by mouth every 6 (six) hours as needed.     sodium chloride  HYPERTONIC 3 % nebulizer solution Take by nebulization 2 (two) times daily as needed for other. 750 mL 12   No facility-administered medications prior to visit.    Review of Systems  Constitutional:  Negative for chills, fever, malaise/fatigue and weight loss.  HENT:  Negative for congestion, sinus pain and sore throat.   Eyes: Negative.   Respiratory:  Positive for cough. Negative for hemoptysis, sputum production, shortness of breath and wheezing.   Cardiovascular:  Negative for chest pain, palpitations, orthopnea, claudication and leg swelling.  Gastrointestinal:  Negative for abdominal pain, heartburn, nausea and vomiting.  Genitourinary: Negative.   Musculoskeletal:  Positive for joint pain. Negative for myalgias.  Skin:  Negative for rash.  Neurological:  Negative for weakness.  Endo/Heme/Allergies: Negative.   Psychiatric/Behavioral: Negative.      Objective:   Vitals:   10/07/23 1457  BP: 136/81  Pulse: 92  SpO2: 98%  Weight: 116 lb (52.6 kg)  Height: 5' 4 (1.626 m)   Physical Exam Constitutional:      General: She is not in acute distress.    Appearance: Normal appearance.  Eyes:      General: No scleral icterus.    Conjunctiva/sclera: Conjunctivae normal.  Cardiovascular:     Rate and Rhythm: Normal rate and regular rhythm.  Pulmonary:     Breath sounds: No wheezing, rhonchi or rales.  Musculoskeletal:     Right lower leg: No edema.     Left lower leg: No edema.  Skin:    General: Skin is warm and dry.  Neurological:     General: No focal deficit present.    CBC    Component Value Date/Time   WBC 4.9 06/18/2023 0932   RBC 4.31 06/18/2023 0932   HGB 12.8 06/18/2023 0932   HCT 39.1 06/18/2023 0932   PLT 379.0 06/18/2023 0932   MCV 90.9 06/18/2023 0932   MCHC 32.7 06/18/2023 0932   RDW 13.4 06/18/2023 0932   LYMPHSABS 1.6 06/18/2023 0932   MONOABS 0.4 06/18/2023 0932   EOSABS 0.3 06/18/2023 0932   BASOSABS 0.1 06/18/2023 0932      Latest Ref Rng & Units 09/24/2011   10:50 AM 12/04/2009   10:28 AM 07/07/2007    8:38 AM  BMP  Glucose 70 - 99 mg/dL 82  77  80   BUN 6 - 23 mg/dL 13  12  12    Creatinine 0.4 - 1.2 mg/dL 0.6  0.7  0.7   Sodium 135 - 145 mEq/L 138  139  136   Potassium 3.5 - 5.1 mEq/L 4.4  4.4  4.0   Chloride 96 - 112 mEq/L 102  105  113   CO2 19 - 32 mEq/L 30  28  30    Calcium 8.4 - 10.5 mg/dL 9.7  9.1  8.4    Chest imaging: HRCT Chest 06/19/23 1. No evidence of fibrotic interstitial lung disease. 2. Mild cylindrical bronchiectasis with scattered patchy peribronchovascular ground-glass, minimal nodularity and consolidation, findings which may be due to organizing pneumonia. Given the nodular configuration in the medial segment right middle lobe, recommend follow-up CT chest without  contrast in 1-2 months in further evaluation, as malignancy cannot be definitively excluded. 3. A few scattered pulmonary nodules measure 4 mm or less in size.  CXR 05/12/23 Partial resolution of right-sided pneumonia. Followup PA and lateral chest X-ray is recommended in another 3-4 weeks to assure resolution.  CXR 04/14/23 Ill-defined patchy pulmonary  infiltrates in the right upper right middle lobe and lingula. May correlate with pneumonia pneumonitis.   PFT:    Latest Ref Rng & Units 07/27/2023    3:13 PM  PFT Results  FVC-Pre L 2.43   FVC-Predicted Pre % 76   FVC-Post L 2.80   FVC-Predicted Post % 88   Pre FEV1/FVC % % 87   Post FEV1/FCV % % 80   FEV1-Pre L 2.10   FEV1-Predicted Pre % 87   FEV1-Post L 2.25   DLCO uncorrected ml/min/mmHg 20.32   DLCO UNC% % 102   DLCO corrected ml/min/mmHg 20.71   DLCO COR %Predicted % 104   DLVA Predicted % 134   TLC L 5.58   TLC % Predicted % 110   RV % Predicted % 78   PFTs 07/27/23 are within normal limits.  Labs:  Path:  Echo:  Heart Catheterization:    Assessment & Plan:   No diagnosis found.  Discussion: Aleiya Nipper is a 68 year old woman, former smoker with history of asthma and osteoporosis of her hips/back who returns to pulmonary clinic for cough.   Bronchiectasis  - autoimmune workup is unrevealing - recommend airway clearance with hypertonic nebs and flutter valve therapy - She does not wish to undergo bronchoscopy at this time - Will obtain sputum cultures - Continue arnuity ellitpa 1 puff daily - will repeat CT Chest scan in 2 months  Follow up in 6 weeks  Dorn Chill, MD [facility] Office: [phone]    Current Outpatient Medications:    ALPRAZolam  (XANAX ) 0.5 MG tablet, TAKE ONE OR TWO TABLETS TWICE DAILY AS NEEDED FOR ANXIETY OR SLEEP, Disp: 45 tablet, Rfl: 0   Ascorbic Acid (VITAMIN C) 1000 MG tablet, Take 1,000 mg by mouth daily., Disp: , Rfl:    azelastine  (ASTELIN ) 137 MCG/SPRAY nasal spray, TWO SPRAYS IN EACH NOSTRIL TWICE DAILY, Disp: 30 mL, Rfl: 11   CELEBREX  200 MG capsule, TAKE ONE CAPSULE EACH DAY, Disp: 30 capsule, Rfl: 3   cetirizine (ZYRTEC) 10 MG tablet, Take 10 mg by mouth as needed.  , Disp: , Rfl:    Cholecalciferol (VITAMIN D3) 1000 UNITS CAPS, Take by mouth.  , Disp: , Rfl:    clobetasol cream  (TEMOVATE) 0.05 %, Apply twice a day to affected areas of body.  Avoid face and folds, Disp: , Rfl:    diclofenac  sodium (VOLTAREN ) 1 % GEL, Apply 2 g topically 2 (two) times daily as needed., Disp: 100 g, Rfl: 1   Eluxadoline (VIBERZI) 100 MG TABS, Take 1 tablet by mouth 2 (two) times daily., Disp: , Rfl:    Fluticasone  Furoate (ARNUITY ELLIPTA ) 200 MCG/ACT AEPB, Inhale 1 puff into the lungs daily., Disp: 30 each, Rfl: 2   Halcinonide 0.1 % CREA, Use on hands and arms as needed, Disp: , Rfl:    halobetasol (ULTRAVATE) 0.05 % cream, Apply twice a day to affected areas of body.SABRAavoid face and folds, Disp: , Rfl:    latanoprost (XALATAN) 0.005 % ophthalmic solution, Place 1 drop into both eyes at bedtime., Disp: , Rfl:    NP THYROID  60 MG tablet, Take 60  mg by mouth daily., Disp: , Rfl:    ondansetron (ZOFRAN) 4 MG tablet, Take 4 mg by mouth every 6 (six) hours as needed., Disp: , Rfl:    sodium chloride  HYPERTONIC 3 % nebulizer solution, Take by nebulization 2 (two) times daily as needed for other., Disp: 750 mL, Rfl: 12

## 2023-10-09 ENCOUNTER — Encounter: Payer: Self-pay | Admitting: Pulmonary Disease

## 2023-10-12 DIAGNOSIS — M25511 Pain in right shoulder: Secondary | ICD-10-CM | POA: Diagnosis not present

## 2023-10-12 DIAGNOSIS — M542 Cervicalgia: Secondary | ICD-10-CM | POA: Diagnosis not present

## 2023-10-20 DIAGNOSIS — M25511 Pain in right shoulder: Secondary | ICD-10-CM | POA: Diagnosis not present

## 2023-10-20 DIAGNOSIS — M542 Cervicalgia: Secondary | ICD-10-CM | POA: Diagnosis not present

## 2023-10-22 ENCOUNTER — Telehealth: Payer: Self-pay

## 2023-10-22 ENCOUNTER — Encounter: Payer: Self-pay | Admitting: Pulmonary Disease

## 2023-10-22 NOTE — Telephone Encounter (Signed)
 I've been on Ipratropium since September 8. I know that's not long, but since I've been on it, my cough has become worse. I think I've pulled another muscle from coughing so much. I've also had very low energy and a lot of headaches. One day I decided I needed some relief so I took codeine cough medicine that I had from this spring. That must've interacted with the Ipratropium because I was dizzy and nauseous for the rest of the day. Also, my stomach has not been feeling well and I've been experiencing heartburn! I have the appointment with the ENT set up for Octoeber 13th.   I guess the point of this message is:  can I stop using the Ipratropium?    Please advise

## 2023-10-29 DIAGNOSIS — M549 Dorsalgia, unspecified: Secondary | ICD-10-CM | POA: Diagnosis not present

## 2023-10-29 DIAGNOSIS — M438X6 Other specified deforming dorsopathies, lumbar region: Secondary | ICD-10-CM | POA: Diagnosis not present

## 2023-10-29 DIAGNOSIS — S32058A Other fracture of fifth lumbar vertebra, initial encounter for closed fracture: Secondary | ICD-10-CM | POA: Diagnosis not present

## 2023-10-29 DIAGNOSIS — M545 Low back pain, unspecified: Secondary | ICD-10-CM | POA: Diagnosis not present

## 2023-10-29 DIAGNOSIS — S32059A Unspecified fracture of fifth lumbar vertebra, initial encounter for closed fracture: Secondary | ICD-10-CM | POA: Diagnosis not present

## 2023-10-29 DIAGNOSIS — Y9317 Activity, water skiing and wake boarding: Secondary | ICD-10-CM | POA: Diagnosis not present

## 2023-10-29 DIAGNOSIS — S32050A Wedge compression fracture of fifth lumbar vertebra, initial encounter for closed fracture: Secondary | ICD-10-CM | POA: Diagnosis not present

## 2023-10-29 DIAGNOSIS — R29890 Loss of height: Secondary | ICD-10-CM | POA: Diagnosis not present

## 2023-10-29 DIAGNOSIS — Z743 Need for continuous supervision: Secondary | ICD-10-CM | POA: Diagnosis not present

## 2023-10-30 DIAGNOSIS — S32050A Wedge compression fracture of fifth lumbar vertebra, initial encounter for closed fracture: Secondary | ICD-10-CM | POA: Diagnosis not present

## 2023-10-30 DIAGNOSIS — M47816 Spondylosis without myelopathy or radiculopathy, lumbar region: Secondary | ICD-10-CM | POA: Diagnosis not present

## 2023-10-31 DIAGNOSIS — Y9317 Activity, water skiing and wake boarding: Secondary | ICD-10-CM | POA: Diagnosis not present

## 2023-10-31 DIAGNOSIS — S32059A Unspecified fracture of fifth lumbar vertebra, initial encounter for closed fracture: Secondary | ICD-10-CM | POA: Diagnosis not present

## 2023-10-31 DIAGNOSIS — M549 Dorsalgia, unspecified: Secondary | ICD-10-CM | POA: Diagnosis not present

## 2023-11-11 DIAGNOSIS — S32050A Wedge compression fracture of fifth lumbar vertebra, initial encounter for closed fracture: Secondary | ICD-10-CM | POA: Diagnosis not present

## 2023-11-16 ENCOUNTER — Encounter (INDEPENDENT_AMBULATORY_CARE_PROVIDER_SITE_OTHER): Payer: Self-pay | Admitting: Otolaryngology

## 2023-11-16 ENCOUNTER — Ambulatory Visit (INDEPENDENT_AMBULATORY_CARE_PROVIDER_SITE_OTHER): Admitting: Otolaryngology

## 2023-11-16 VITALS — BP 125/76 | HR 97 | Temp 98.5°F

## 2023-11-16 DIAGNOSIS — R053 Chronic cough: Secondary | ICD-10-CM

## 2023-11-16 DIAGNOSIS — R065 Mouth breathing: Secondary | ICD-10-CM | POA: Diagnosis not present

## 2023-11-16 DIAGNOSIS — R0982 Postnasal drip: Secondary | ICD-10-CM

## 2023-11-16 DIAGNOSIS — J343 Hypertrophy of nasal turbinates: Secondary | ICD-10-CM

## 2023-11-16 DIAGNOSIS — J342 Deviated nasal septum: Secondary | ICD-10-CM

## 2023-11-16 DIAGNOSIS — J3089 Other allergic rhinitis: Secondary | ICD-10-CM

## 2023-11-16 DIAGNOSIS — Z87891 Personal history of nicotine dependence: Secondary | ICD-10-CM

## 2023-11-16 DIAGNOSIS — R0981 Nasal congestion: Secondary | ICD-10-CM | POA: Diagnosis not present

## 2023-11-16 DIAGNOSIS — K219 Gastro-esophageal reflux disease without esophagitis: Secondary | ICD-10-CM

## 2023-11-16 MED ORDER — LEVOCETIRIZINE DIHYDROCHLORIDE 5 MG PO TABS: 5.0000 mg | ORAL_TABLET | Freq: Every evening | ORAL | 3 refills | Status: AC

## 2023-11-16 MED ORDER — FAMOTIDINE 20 MG PO TABS: 20.0000 mg | ORAL_TABLET | Freq: Two times a day (BID) | ORAL | 3 refills | Status: AC

## 2023-11-16 NOTE — Patient Instructions (Addendum)
 [email or web address] - check out this website to learn more about reflux   -Avoid lying down for at least two hours after a meal or after drinking acidic beverages, like soda, or other caffeinated beverages. This can help to prevent stomach contents from flowing back into the esophagus. -Keep your head elevated while you sleep. Using an extra pillow or two can also help to prevent reflux. -Eat smaller and more frequent meals each day instead of a few large meals. This promotes digestion and can aid in preventing heartburn. -Wear loose-fitting clothes to ease pressure on the stomach, which can worsen heartburn and reflux. -Reduce excess weight around the midsection. This can ease pressure on the stomach. Such pressure can force some stomach contents back up the esophagus   - Take Reflux Gourmet (natural supplement available on [employer]) to help with symptoms of chronic throat irritation      Superior laryngeal nerve block for chronic cough, throat clearing, or pain  Some patients have symptoms from inflammation or hypersensitivity of the nerve that provides sensation to the inside of the throat. This nerve enters the throat right above the Adam's apple, and there is one on each side. It is called the "superior laryngeal nerve."  An injection of lidocaine and steroid can be given to this area where the sensory nerve enters the throat.  This sometimes helps patients with an oversensitive nerve or cough reflex. It "resets" an abnormal cough threshold or overactive pain signals from the throat. The steroid acts to decrease any inflammation around the nerve that could be adding to this hypersensitivity.  The injection may help right away, or it may take up to a week or two to start working.  If it hasn't helped at all after 2-3 weeks, Dr Tabithia Stroder will often try a second injection.  Some patients with no response to the first injection still have a good response to the second one.  There is no set  schedule for repeating these injections.  It is based entirely on how long they help your symptoms.  If you find the injection helpful but the symptoms come back several months later, then you can repeat it at that time.  Some patients never need a second injection.  Side effects from the injection are mostly related to numbness inside the throat.  If this happens, you may feel one or both sides of your throat go numb. This will last about 1 hour, and you should not swallow anything until the sensation returns to normal, usually about an hour.  It is sometimes frightening to patients when the sensation inside their throat changes, but it is not dangerous.  You may choose to stay in our waiting room until the feeling goes away, but this is not a requirement.  If this happens, it will improve gradually over the hour after the procedure. Other risks include accidental injection into the blood vessels, pain, temporary swallowing trouble or voice changes, and side effects related to steroids generally, like raised blood sugar or increased appetite. Many patients do not notice any steroid-related side effects from the injection, but if you have never taken steroids or aren't familiar with their effects, you should ask Dr Kassiah Mccrory for additional details.  Diabetics shouyld plan to check their blood sugar more frequently in the 2 days after the injection.  The injection takes approximately 1 minute per side, and\ it is performed during a routine office visit. There are no precautions afterward except for not swallowing for 1 hour if  throat numbness occurs. Patients can drive themselves to and from the visit. There is no recovery time, but some patients have mild bruising or tenderness at the injection site.

## 2023-11-16 NOTE — Progress Notes (Signed)
 ENT CONSULT:  Reason for Consult: chronic cough    HPI: Discussed the use of AI scribe software for clinical note transcription with the patient, who gave verbal consent to proceed.  History of Present Illness Tasha Jackson is a 68 year old female with a history of pneumonia 03/2023 who presents with persistent chronic cough. She was referred by Dr. Kara, [place], for evaluation of chronic cough.  She has been experiencing a chronic cough since having pneumonia in February, approximately six to seven months ago. The cough varies in nature, sometimes being dry and hacky, and other times deeper. She is unable to produce mucus when coughing, despite attempts to provide a sample. She has a history of smoking in her twenties but has since quit.  She has been evaluated by a pulmonary doctor who mentioned bronchiectasis. She has tried various treatments including a nebulizer, multiple inhalers, and nasal sprays. The latest nasal spray she tried was ipratropium, which she did not tolerate well as it either increased her cough or made her feel 'weird'. Currently, she is using Flonase  and Astelin  nasal sprays, and takes Claritin.  She has a history of allergies and chronic sinus infections since her thirties but has not undergone surgery for sinus issues. She does not recall having imaging of her sinuses. Cold drinks and reclining can trigger her cough.  She snores and wakes up frequently, mouth breathing at night   Records Reviewed:  Pulm Dr Kara Office visit 10/09/23   She has a persistent cough that remains unchanged and is unable to produce sputum for testing. Nasal discharge occurs with coughing, but no fever, chills, or night sweats are present. Appetite is good, and weight is stable. Coughing increases when lying down or when exposed to air conditioning or a fan. The cough does not typically disturb her sleep but occurs upon reclining.   She experiences frequent sinus infections, especially  during ragweed season, and has headaches. She uses Astelin  nasal spray and takes Zyrtec daily. She discontinued Flonase  two years ago due to glaucoma concerns.   CT Chest scan 8/1/5 reviewed with patient. Overall persistent consolidation or RML is improved. Areas of focal bronchiectasis.   Chronic Cough - refer to ENT for evaluation of upper airway - start flonase  and ipratropium for possible post-nasal drip involvement - stop astelin  nasal spray - she will need to be in touch with her ophthalmologist regarding eye pressure checks - consider GERD treatment with famotidine 20mg  at bed time in the future if cough not improved    Past Medical History:  Diagnosis Date   Allergy    TAKE 3 PILLS ALL YEAR ROUND   Anxiety    Asthma    during exercise   HSV (herpes simplex virus) infection    Hypothyroidism    IBS (irritable bowel syndrome)    IBS (irritable bowel syndrome)    Lactose intolerance    Osteopenia    Osteoporosis    PVC (premature ventricular contraction)     Past Surgical History:  Procedure Laterality Date   COLONOSCOPY     ELBOW SURGERY Right 2007   Tennis elbow     Family History  Problem Relation Age of Onset   Melanoma Mother    Hypertension Father    Heart disease Father    Breast cancer Maternal Grandmother        Pt not sure date of onset   Colon cancer Neg Hx    Esophageal cancer Neg Hx    Rectal  cancer Neg Hx    Stomach cancer Neg Hx     Social History:  reports that she quit smoking about 40 years ago. Her smoking use included cigarettes. She has never used smokeless tobacco. She reports current alcohol  use. She reports that she does not use drugs.  Allergies:  Allergies  Allergen Reactions   Amoxicillin-Pot Clavulanate     REACTION: GI upset   Fluticasone      Other reaction(s): increased eye pressures   Lactose Intolerance (Gi)    Other Itching    Certain Chemicals cause contact dermatitis-itching, redness and skin peels of hands     Prednisone     Other reaction(s): increased intraocular pressure   Sulfa Antibiotics     Other reaction(s): Unknown    Medications: I have reviewed the patient's current medications.  The PMH, PSH, Medications, Allergies, and SH were reviewed and updated.  ROS: Constitutional: Negative for fever, weight loss and weight gain. Cardiovascular: Negative for chest pain and dyspnea on exertion. Respiratory: Is not experiencing shortness of breath at rest. Gastrointestinal: Negative for nausea and vomiting. Neurological: Negative for headaches. Psychiatric: The patient is not nervous/anxious  Blood pressure 125/76, pulse 97, temperature 98.5 F (36.9 C), last menstrual period 12/10/2010, SpO2 96%. There is no height or weight on file to calculate BMI.  PHYSICAL EXAM:  Exam: General: Well-developed, well-nourished Communication and Voice: Clear pitch and clarity Respiratory Respiratory effort: Equal inspiration and expiration without stridor Cardiovascular Peripheral Vascular: Warm extremities with equal color/perfusion Eyes: No nystagmus with equal extraocular motion bilaterally Neuro/Psych/Balance: Patient oriented to person, place, and time; Appropriate mood and affect; Gait is intact with no imbalance; Cranial nerves I-XII are intact Head and Face Inspection: Normocephalic and atraumatic without mass or lesion Palpation: Facial skeleton intact without bony stepoffs Salivary Glands: No mass or tenderness Facial Strength: Facial motility symmetric and full bilaterally ENT Pinna: External ear intact and fully developed External canal: Canal is patent with intact skin Tympanic Membrane: Clear and mobile External Nose: No scar or anatomic deformity Internal Nose: Septum is deviated to the right. No polyp, or purulence. Mucosal edema and erythema present.  Bilateral inferior turbinate hypertrophy.  Lips, Teeth, and gums: Mucosa and teeth intact and viable TMJ: No pain to palpation  with full mobility Oral cavity/oropharynx: No erythema or exudate, no lesions present Nasopharynx: No mass or lesion with intact mucosa Hypopharynx: Intact mucosa without pooling of secretions Larynx Glottic: Full true vocal cord mobility without lesion or mass Supraglottic: Normal appearing epiglottis and AE folds Interarytenoid Space: Moderate pachydermia&edema Subglottic Space: Patent without lesion or edema Neck Neck and Trachea: Midline trachea without mass or lesion Thyroid : No mass or nodularity Lymphatics: No lymphadenopathy  Procedure: Preoperative diagnosis: chronic cough   Postoperative diagnosis:   Same  Procedure: Flexible fiberoptic laryngoscopy  Surgeon: Elena Larry, MD  Anesthesia: Topical lidocaine and Afrin Complications: None Condition is stable throughout exam  Indications and consent:  The patient presents to the clinic with above symptoms. Indirect laryngoscopy view was incomplete. Thus it was recommended that they undergo a flexible fiberoptic laryngoscopy. All of the risks, benefits, and potential complications were reviewed with the patient preoperatively and verbal informed consent was obtained.  Procedure: The patient was seated upright in the clinic. Topical lidocaine and Afrin were applied to the nasal cavity. After adequate anesthesia had occurred, I then proceeded to pass the flexible telescope into the nasal cavity. The nasal cavity was patent without rhinorrhea or polyp. The nasopharynx was also patent without mass  or lesion. The base of tongue was visualized and was normal. There were no signs of pooling of secretions in the piriform sinuses. The true vocal folds were mobile bilaterally. There were no signs of glottic or supraglottic mucosal lesion or mass. There was moderate interarytenoid pachydermia and post cricoid edema. The telescope was then slowly withdrawn and the patient tolerated the procedure throughout.    Studies Reviewed: CT chest  09/04/23 IMPRESSION: Persistent consolidation in the medial aspect of the right middle lobe overall improved when compared with the prior exam of May 2025. The more peripheral inflammatory changes seen on the most recent exam in June have resolved. Continued follow-up in 3-6 months is recommended to assess for further resolution.   Scattered small less than 4 mm pulmonary nodules. No follow-up needed if patient is low-risk (and has no known or suspected primary neoplasm). Non-contrast chest CT can be considered in 12 months if patient is high-risk. This recommendation follows the consensus statement:  Assessment/Plan: Encounter Diagnoses  Name Primary?   Chronic cough Yes   Chronic GERD    Environmental and seasonal allergies    Chronic nasal congestion    Post-nasal drip    Nasal septal deviation    Hypertrophy of both inferior nasal turbinates     Assessment and Plan Assessment & Plan Chronic cough with postnasal drip and gastroesophageal reflux disease Chronic cough likely due to postnasal drip and GERD. Symptoms exacerbated by reclining and cold beverages. Previous treatments ineffective. - Prescribed Pepcid for reflux management. - Recommend Reflux Gourmet supplement after meals. - Continue Flonase  and Astelin  nasal sprays. - Consider Xyzal at night for allergy-related postnasal drainage. - Provided information on neurogenic cough SLN block injection with steroid and lidocaine for future consideration.  Chronic nasal congestion and environmental allergies  Chronic nasal congestion, mouth breathing. Possible allergic component to postnasal drainage. She was previously tested for allergies but it was long time ago. She is interested in allergy shots.  - Xyzal 5 mg daily and Flonase /Astelin  BID - Refer to allergy specialist for testing and evaluation for allergy shots.  Suspected sleep apnea Symptoms suggestive of sleep apnea, including snoring and mouth breathing. Right  nasal passage slightly tighter due to septal deviation. - Consider sleep study to evaluate for sleep apnea. She was advised to discuss with PCP   Thank you for allowing me to participate in the care of this patient. Please do not hesitate to contact me with any questions or concerns.   Elena Larry, MD Otolaryngology [facility] Phone: [phone] Fax: [phone]    11/16/2023, 2:05 PM

## 2023-11-17 DIAGNOSIS — E041 Nontoxic single thyroid nodule: Secondary | ICD-10-CM | POA: Diagnosis not present

## 2023-11-17 DIAGNOSIS — M810 Age-related osteoporosis without current pathological fracture: Secondary | ICD-10-CM | POA: Diagnosis not present

## 2023-11-17 DIAGNOSIS — M4850XA Collapsed vertebra, not elsewhere classified, site unspecified, initial encounter for fracture: Secondary | ICD-10-CM | POA: Diagnosis not present

## 2023-11-21 DIAGNOSIS — Z23 Encounter for immunization: Secondary | ICD-10-CM | POA: Diagnosis not present

## 2023-12-01 DIAGNOSIS — L2389 Allergic contact dermatitis due to other agents: Secondary | ICD-10-CM | POA: Diagnosis not present

## 2023-12-01 DIAGNOSIS — L821 Other seborrheic keratosis: Secondary | ICD-10-CM | POA: Diagnosis not present

## 2023-12-01 DIAGNOSIS — D1801 Hemangioma of skin and subcutaneous tissue: Secondary | ICD-10-CM | POA: Diagnosis not present

## 2023-12-01 DIAGNOSIS — L814 Other melanin hyperpigmentation: Secondary | ICD-10-CM | POA: Diagnosis not present

## 2023-12-04 DIAGNOSIS — M4850XA Collapsed vertebra, not elsewhere classified, site unspecified, initial encounter for fracture: Secondary | ICD-10-CM | POA: Diagnosis not present

## 2023-12-04 DIAGNOSIS — M810 Age-related osteoporosis without current pathological fracture: Secondary | ICD-10-CM | POA: Diagnosis not present

## 2023-12-10 DIAGNOSIS — Z8781 Personal history of (healed) traumatic fracture: Secondary | ICD-10-CM | POA: Diagnosis not present

## 2023-12-10 DIAGNOSIS — K219 Gastro-esophageal reflux disease without esophagitis: Secondary | ICD-10-CM | POA: Diagnosis not present

## 2023-12-10 DIAGNOSIS — M810 Age-related osteoporosis without current pathological fracture: Secondary | ICD-10-CM | POA: Diagnosis not present

## 2023-12-10 DIAGNOSIS — R12 Heartburn: Secondary | ICD-10-CM | POA: Diagnosis not present

## 2023-12-10 DIAGNOSIS — E041 Nontoxic single thyroid nodule: Secondary | ICD-10-CM | POA: Diagnosis not present

## 2023-12-10 DIAGNOSIS — K5289 Other specified noninfective gastroenteritis and colitis: Secondary | ICD-10-CM | POA: Diagnosis not present

## 2023-12-16 ENCOUNTER — Other Ambulatory Visit (HOSPITAL_COMMUNITY): Payer: Self-pay | Admitting: Internal Medicine

## 2023-12-16 DIAGNOSIS — M810 Age-related osteoporosis without current pathological fracture: Secondary | ICD-10-CM | POA: Insufficient documentation

## 2023-12-16 DIAGNOSIS — K52832 Lymphocytic colitis: Secondary | ICD-10-CM | POA: Insufficient documentation

## 2023-12-16 DIAGNOSIS — R12 Heartburn: Secondary | ICD-10-CM | POA: Insufficient documentation

## 2023-12-16 DIAGNOSIS — K219 Gastro-esophageal reflux disease without esophagitis: Secondary | ICD-10-CM | POA: Insufficient documentation

## 2023-12-16 DIAGNOSIS — Z8781 Personal history of (healed) traumatic fracture: Secondary | ICD-10-CM | POA: Insufficient documentation

## 2023-12-17 ENCOUNTER — Telehealth (HOSPITAL_COMMUNITY): Payer: Self-pay

## 2023-12-17 NOTE — Telephone Encounter (Signed)
 Auth Submission: NO AUTH NEEDED Site of care: Site of care: [facility] Payer: [employer] Medication & CPT/J Code(s) submitted: Reclast (Zolendronic acid) [id number] Diagnosis Code: M81.0 Route of submission (phone, fax, portal):  Phone # Fax # Auth type: Buy/Bill HB Units/visits requested: 5mg  x 1 dose Reference number:  Approval from: 12/17/23 to 02/03/24

## 2023-12-24 ENCOUNTER — Inpatient Hospital Stay (HOSPITAL_COMMUNITY): Admission: RE | Admit: 2023-12-24 | Source: Ambulatory Visit

## 2023-12-24 DIAGNOSIS — S32050A Wedge compression fracture of fifth lumbar vertebra, initial encounter for closed fracture: Secondary | ICD-10-CM | POA: Diagnosis not present

## 2024-01-08 ENCOUNTER — Encounter (HOSPITAL_COMMUNITY)

## 2024-01-11 ENCOUNTER — Inpatient Hospital Stay (HOSPITAL_COMMUNITY): Admission: RE | Admit: 2024-01-11 | Discharge: 2024-01-11 | Attending: Internal Medicine

## 2024-01-11 VITALS — BP 125/77 | HR 84 | Temp 98.2°F | Resp 16

## 2024-01-11 DIAGNOSIS — Z8781 Personal history of (healed) traumatic fracture: Secondary | ICD-10-CM

## 2024-01-11 DIAGNOSIS — K219 Gastro-esophageal reflux disease without esophagitis: Secondary | ICD-10-CM

## 2024-01-11 DIAGNOSIS — M810 Age-related osteoporosis without current pathological fracture: Secondary | ICD-10-CM

## 2024-01-11 DIAGNOSIS — K52832 Lymphocytic colitis: Secondary | ICD-10-CM

## 2024-01-11 DIAGNOSIS — R12 Heartburn: Secondary | ICD-10-CM | POA: Diagnosis not present

## 2024-01-11 MED ORDER — ACETAMINOPHEN 325 MG PO TABS: 650.0000 mg | ORAL_TABLET | Freq: Once | ORAL | Status: DC

## 2024-01-11 MED ORDER — DIPHENHYDRAMINE HCL 25 MG PO CAPS: 25.0000 mg | ORAL_CAPSULE | Freq: Once | ORAL | Status: DC

## 2024-01-11 MED ORDER — ZOLEDRONIC ACID 5 MG/100ML IV SOLN
INTRAVENOUS | Status: AC
  Filled 2024-01-11: qty 100

## 2024-01-11 MED ORDER — ZOLEDRONIC ACID 5 MG/100ML IV SOLN
5.0000 mg | Freq: Once | INTRAVENOUS | Status: AC
  Administered 2024-01-11: 5 mg via INTRAVENOUS

## 2024-01-11 MED ORDER — SODIUM CHLORIDE 0.9 % IV SOLN: INTRAVENOUS | Status: DC

## 2024-01-19 DIAGNOSIS — R52 Pain, unspecified: Secondary | ICD-10-CM | POA: Diagnosis not present

## 2024-02-18 ENCOUNTER — Ambulatory Visit (INDEPENDENT_AMBULATORY_CARE_PROVIDER_SITE_OTHER): Admitting: Otolaryngology

## 2024-03-24 ENCOUNTER — Other Ambulatory Visit
